# Patient Record
Sex: Male | Born: 1975 | Race: White | Hispanic: No | Marital: Married | Smoking: Former smoker
Health system: Southern US, Community
[De-identification: ages and names within clinical notes are randomized; demographics above are authoritative.]

## PROBLEM LIST (undated history)

## (undated) DIAGNOSIS — S82892A Other fracture of left lower leg, initial encounter for closed fracture: Secondary | ICD-10-CM

## (undated) HISTORY — DX: Other fracture of left lower leg, initial encounter for closed fracture: S82.892A

---

## 2004-05-03 DIAGNOSIS — S82892A Other fracture of left lower leg, initial encounter for closed fracture: Secondary | ICD-10-CM

## 2004-05-03 HISTORY — DX: Other fracture of left lower leg, initial encounter for closed fracture: S82.892A

## 2008-05-03 HISTORY — PX: KNEE ARTHROSCOPY WITH ANTERIOR CRUCIATE LIGAMENT (ACL) REPAIR: SHX5644

## 2013-05-11 ENCOUNTER — Encounter (HOSPITAL_COMMUNITY): Payer: Self-pay | Admitting: Emergency Medicine

## 2013-05-11 ENCOUNTER — Emergency Department (HOSPITAL_COMMUNITY)
Admission: EM | Admit: 2013-05-11 | Discharge: 2013-05-11 | Disposition: A | Discharge status: Home / Self Care | Payer: Medicaid Other | Source: Home / Self Care

## 2013-05-11 DIAGNOSIS — S61409A Unspecified open wound of unspecified hand, initial encounter: Secondary | ICD-10-CM

## 2013-05-11 DIAGNOSIS — S61411A Laceration without foreign body of right hand, initial encounter: Secondary | ICD-10-CM

## 2013-05-11 MED ORDER — TETANUS-DIPHTH-ACELL PERTUSSIS 5-2.5-18.5 LF-MCG/0.5 IM SUSP
INTRAMUSCULAR | Status: AC
  Filled 2013-05-11: qty 0.5

## 2013-05-11 MED ORDER — TETANUS-DIPHTH-ACELL PERTUSSIS 5-2.5-18.5 LF-MCG/0.5 IM SUSP
0.5000 mL | Freq: Once | INTRAMUSCULAR | Status: AC
  Administered 2013-05-11: 0.5 mL via INTRAMUSCULAR

## 2013-05-11 NOTE — ED Provider Notes (Signed)
CSN: [id number]     Arrival date & time 05/11/13  1227 History   First MD Initiated Contact with Patient 05/11/13 1435     Chief Complaint  Patient presents with  . Extremity Laceration   (Consider location/radiation/quality/duration/timing/severity/associated sxs/prior Treatment) HPI Comments: 38 year old Arabic male accidentally stabbed the left palm with a kitchen knife. This produced a 1/2 cm laceration. It is located at the base of the thenar eminence.   History reviewed. No pertinent past medical history. History reviewed. No pertinent past surgical history. History reviewed. No pertinent family history. History  Substance Use Topics  . Smoking status: Never Smoker   . Smokeless tobacco: Not on file  . Alcohol Use: Yes    Review of Systems  All other systems reviewed and are negative.    Allergies  Review of patient's allergies indicates no known allergies.  Home Medications  No current outpatient prescriptions on file. BP 129/84  Pulse 90  Temp(Src) 98.3 F (36.8 C)  Resp 16  SpO2 98% Physical Exam  Nursing note and vitals reviewed. Constitutional: He is oriented to person, place, and time. He appears well-developed and well-nourished. No distress.  Pulmonary/Chest: Effort normal. No respiratory distress.  Musculoskeletal: He exhibits no edema and no tenderness.  Neurological: He is alert and oriented to person, place, and time. He exhibits normal muscle tone.  Skin: Skin is warm and dry.  1.5 cm laceration to the base of the left thenar eminence. Superficial.  Psychiatric: He has a normal mood and affect.    ED Course  LACERATION REPAIR Date/Time: 05/11/2013 3:41 PM Performed by: Phineas RealMABE, Phillip Maffei Authorized by: Bradd CanaryKINDL, JAMES D Consent: Verbal consent obtained. Risks and benefits: risks, benefits and alternatives were discussed Consent given by: patient Patient understanding: patient states understanding of the procedure being performed Patient identity  confirmed: verbally with patient Body area: upper extremity Location details: left hand Laceration length: 1.5 cm Tendon involvement: none Nerve involvement: none Vascular damage: no Local anesthetic: lidocaine 2% with epinephrine Anesthetic total: 3 ml Patient sedated: no Preparation: Patient was prepped and draped in the usual sterile fashion. Irrigation solution: saline Irrigation method: syringe Amount of cleaning: standard Debridement: minimal Degree of undermining: none Skin closure: 4-0 nylon Number of sutures: 3 Technique: simple Approximation: close Approximation difficulty: simple   (including critical care time) Labs Review Labs Reviewed - No data to display Imaging Review No results found.    MDM   1. Laceration of hand, right, initial encounter     Sutured laceration Wound care instructions, watch for infection Return 10 days for SR, sooner for problems  Hayden Rasmussenavid Ishana Blades, NP 05/11/13 1543

## 2013-05-11 NOTE — Discharge Instructions (Signed)
Laceration Care, Adult °A laceration is a cut or lesion that goes through all layers of the skin and into the tissue just beneath the skin. °TREATMENT  °Some lacerations may not require closure. Some lacerations may not be able to be closed due to an increased risk of infection. It is important to see your caregiver as soon as possible after an injury to minimize the risk of infection and maximize the opportunity for successful closure. °If closure is appropriate, pain medicines may be given, if needed. The wound will be cleaned to help prevent infection. Your caregiver will use stitches (sutures), staples, wound glue (adhesive), or skin adhesive strips to repair the laceration. These tools bring the skin edges together to allow for faster healing and a better cosmetic outcome. However, all wounds will heal with a scar. Once the wound has healed, scarring can be minimized by covering the wound with sunscreen during the day for 1 full year. °HOME CARE INSTRUCTIONS  °For sutures or staples: °· Keep the wound clean and dry. °· If you were given a bandage (dressing), you should change it at least once a day. Also, change the dressing if it becomes wet or dirty, or as directed by your caregiver. °· Wash the wound with soap and water 2 times a day. Rinse the wound off with water to remove all soap. Pat the wound dry with a clean towel. °· After cleaning, apply a thin layer of the antibiotic ointment as recommended by your caregiver. This will help prevent infection and keep the dressing from sticking. °· You may shower as usual after the first 24 hours. Do not soak the wound in water until the sutures are removed. °· Only take over-the-counter or prescription medicines for pain, discomfort, or fever as directed by your caregiver. °· Get your sutures or staples removed as directed by your caregiver. °For skin adhesive strips: °· Keep the wound clean and dry. °· Do not get the skin adhesive strips wet. You may bathe  carefully, using caution to keep the wound dry. °· If the wound gets wet, pat it dry with a clean towel. °· Skin adhesive strips will fall off on their own. You may trim the strips as the wound heals. Do not remove skin adhesive strips that are still stuck to the wound. They will fall off in time. °For wound adhesive: °· You may briefly wet your wound in the shower or bath. Do not soak or scrub the wound. Do not swim. Avoid periods of heavy perspiration until the skin adhesive has fallen off on its own. After showering or bathing, gently pat the wound dry with a clean towel. °· Do not apply liquid medicine, cream medicine, or ointment medicine to your wound while the skin adhesive is in place. This may loosen the film before your wound is healed. °· If a dressing is placed over the wound, be careful not to apply tape directly over the skin adhesive. This may cause the adhesive to be pulled off before the wound is healed. °· Avoid prolonged exposure to sunlight or tanning lamps while the skin adhesive is in place. Exposure to ultraviolet light in the first year will darken the scar. °· The skin adhesive will usually remain in place for 5 to 10 days, then naturally fall off the skin. Do not pick at the adhesive film. °You may need a tetanus shot if: °· You cannot remember when you had your last tetanus shot. °· You have never had a tetanus   shot. If you get a tetanus shot, your arm may swell, get red, and feel warm to the touch. This is common and not a problem. If you need a tetanus shot and you choose not to have one, there is a rare chance of getting tetanus. Sickness from tetanus can be serious. SEEK MEDICAL CARE IF:   You have redness, swelling, or increasing pain in the wound.  You see a red line that goes away from the wound.  You have yellowish-white fluid (pus) coming from the wound.  You have a fever.  You notice a bad smell coming from the wound or dressing.  Your wound breaks open before or  after sutures have been removed.  You notice something coming out of the wound such as wood or glass.  Your wound is on your hand or foot and you cannot move a finger or toe. SEEK IMMEDIATE MEDICAL CARE IF:   Your pain is not controlled with prescribed medicine.  You have severe swelling around the wound causing pain and numbness or a change in color in your arm, hand, leg, or foot.  Your wound splits open and starts bleeding.  You have worsening numbness, weakness, or loss of function of any joint around or beyond the wound.  You develop painful lumps near the wound or on the skin anywhere on your body. MAKE SURE YOU:   Understand these instructions.  Will watch your condition.  Will get help right away if you are not doing well or get worse. Document Released: 04/19/2005 Document Revised: 07/12/2011 Document Reviewed: 10/13/2010 [facility] Patient Information 2014 [place].  Herida por pinchadura (Puncture Wound)  Una herida punzante es la que atraviesa todas las capas de la piel y del tejido por debajo de la piel(tejido subcutneo). Una herida punzante puede infectarse con facilidad cuando los grmenes ingresan al organismo y penetran debajo de la piel durante la lesin. Una herida profunda con un pequeo punto de entrada le hace difcil al mdico limpiarla adecuadamente. Por ejemplo cuando se pisa un clavo y ste ha atravesado la suela sucia de un zapato o en otras situaciones en las que la herida se ha contaminado.  CAUSAS  Muchas heridas punzantes se deben a vidrio, clavos, esquirlas, espinas de pescado u otros objetos que han ingresado a la piel (cuerpo extrao). Tambin pueden producirla las mordeduras humanas o de Kennedyanimales.  DIAGNSTICO  Generalmente se diagnostica con la historia clnica y el examen fsico. Puede ser necesario que le tomen una radiografa o una ecografa para controlar si qued algn cuerpo extrao en la herida.  TRATAMIENTO   El mdico har una  limpieza lo ms profunda posible. Segn su ubicacin, podr aplicarle unvendaje.  El Oncologistprofesional le prescribir antibiticos.  Puede ser necesario que concurra a una visita de control. Siga todas las indicaciones del mdico. INSTRUCCIONES PARA EL CUIDADO DOMICILIARIO  Cambie el vendaje una vez por da o segn le indic el profesional. Si el vendaje se adhiere, podr retirarlo remojndolo con agua.  Si el mdico se lo ha indicado, es muy importante que vuelva para Artistrealizar un control. No cumplir con este control puede dar como resultado que el dao, el dolor o la discapacidad sean permanentes o crnicos.  Slo tome medicamentos de venta libre o prescriptos para Primary school teachercalmar el dolor, las St. Michaelmolestias, o bajar la fiebre segn las indicaciones de su mdico.  Si le han recetado antibiticos, tmelos segn las indicaciones. Tmelos todos, aunque se sienta mejor. Deber aplicarse la vacuna contra el ttanos  si:  No recuerda cundo se coloc la vacuna la ltima vez.  Nunca recibi esta vacuna. Si le han aplicado la vacuna contra el ttanos, el brazo podr hincharse, enrojecer y sentirse caliente al tacto. Esto es frecuente y no es un problema. Si usted necesita aplicarse la vacuna y se niega a recibirla, corre riesgo de contraer ttanos. sta es una enfermedad grave.  Si un Regulatory affairs officer la herida, debe recibir la vacuna contra la rabia.  SOLICITE ATENCIN MDICA SI:  Presenta enrojecimiento, hinchazn o aumento del dolor en la herida.  Hay rayas rojas que salen de la herida.  Advierte un olor ftido que proviene de la herida o del vendaje.  Aparece pus en la herida.  Se le ha administrado un antibitico para la infeccin, pero no parece mejorar.  Observa algo en la herida como goma del zapato, tela u otro objeto.  Tiene fiebre.  Siente dolor intenso.  Tiene dificultad para respirar.  Se siente mareado o sufre un desmayo.  No puede dejar de vomitar.  Pierde la sensibilidad, hay  adormecimiento, o no puede mover el miembro por debajo de la herida.  Los sntomas empeoran. ASEGRESE DE QUE:   Comprende estas instrucciones.  Controlar su enfermedad.  Solicitar ayuda de inmediato si no mejora o si empeora. Document Released: 01/27/2005 Document Revised: 07/12/2011 [facility] Patient Information 2014 [place], [place].

## 2013-05-11 NOTE — ED Notes (Signed)
Holding knife in right hand to prepare food, knife slipped, sustained laceration to left palm. Denies other injury, bleeding controlled, no numbness, good ROM fingers

## 2013-05-14 ENCOUNTER — Encounter: Payer: Self-pay | Admitting: Family Medicine

## 2013-05-14 ENCOUNTER — Ambulatory Visit (INDEPENDENT_AMBULATORY_CARE_PROVIDER_SITE_OTHER): Payer: Medicaid Other | Admitting: Family Medicine

## 2013-05-14 VITALS — BP 134/82 | HR 70 | Temp 97.9°F | Ht 69.5 in | Wt 245.0 lb

## 2013-05-14 DIAGNOSIS — M25569 Pain in unspecified knee: Secondary | ICD-10-CM

## 2013-05-14 DIAGNOSIS — I8390 Asymptomatic varicose veins of unspecified lower extremity: Secondary | ICD-10-CM

## 2013-05-14 DIAGNOSIS — M25572 Pain in left ankle and joints of left foot: Secondary | ICD-10-CM

## 2013-05-14 DIAGNOSIS — M25579 Pain in unspecified ankle and joints of unspecified foot: Secondary | ICD-10-CM

## 2013-05-14 DIAGNOSIS — M25561 Pain in right knee: Secondary | ICD-10-CM | POA: Insufficient documentation

## 2013-05-14 NOTE — Assessment & Plan Note (Signed)
Uncomplicated, nothing to do.

## 2013-05-14 NOTE — Assessment & Plan Note (Addendum)
Some deformity noted Will get xrays, not repaired in [place] due to his ethnicity.   Will call with results.

## 2013-05-14 NOTE — ED Provider Notes (Signed)
Medical screening examination/treatment/procedure(s) were performed by resident physician or non-physician practitioner and as supervising physician I was immediately available for consultation/collaboration.   Torin Modica DOUGLAS MD.   Shoshanna Mcquitty D Yolinda Duerr, MD 05/14/13 1426 

## 2013-05-14 NOTE — Progress Notes (Signed)
Subjective:    Edward Klein is a 38 y.o. male [facility].  Patient is a refugee from [place] via [place], arrived in [place] in Nov 2014.  :  1.  Right knee pain:  Had ACL repair in 2010.  Injury occurred secondary to construction, fell from a height.  Still with some residual pain.  Worse with prolonged standing or after prolonged walking.  Doesn't like taking medications, occasional usage, see below.   2.  Left ankle:  Fractured ankle 2006 after falling during construction. No treatment while in [place].  Unable to walk for 3 months while it "healed" on its own.  Has occasional swelling and pain, especially after long day of work.  Takes unknown prescription of his brothers with pain relief, no OTC analgesics.    3.  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  He doesn't know what this is.  Concerned it is "nerve injury."  Swells after long day of work.  No pain or numbness.     ROS as above per HPI, otherwise neg.  Pertinently, no chest pain, palpitations, SOB, Fever, Chills, Abd pain, N/V/D.   The following portions of the patient's history were reviewed and updated as appropriate: allergies, current medications, past medical history, family and social history, and problem list. Patient is a nonsmoker.    PMH reviewed.  No past medical history on file. No past surgical history on file.  Medications reviewed. No current outpatient prescriptions on file.   No current facility-administered medications for this visit.     Objective:   Physical Exam BP 134/82  Pulse 70  Temp(Src) 97.9 F (36.6 C) (Oral)  Ht 5' 9.5" (1.765 m)  Wt 245 lb (111.131 kg)  BMI 35.67 kg/m2 Gen:  Alert, cooperative patient who appears stated age in no acute distress.  Vital signs reviewed. HEENT: EOMI,  MMM Cardiac:  Regular rate and rhythm without murmur auscultated.  Good S1/S2. Pulm:  Clear to auscultation bilaterally with good air movement.  No wheezes or rales noted.    MSK:  Left knee WNL.  Right knee with no tenderness to palpation.  No effusion noted.  Ant/post drawer with good stability, no laxity noted.  No varus/valgus laxity noted.   Right ankle WNL.  Left ankle with some chronic deformity noted, some pain with inversion of ankle.  No bruising noted.  Exts: Non edematous BL  LE, warm and well perfused.  5 cm varicose vein cluster noted on medial aspect of Left thigh.  No tenderness.   Neuro:  No focal deficits Psych:  Pleasant, smiling, not depressed, anxious appearing  No results found for this or any previous visit (from the past 72 hour(s)).

## 2013-05-14 NOTE — Patient Instructions (Signed)
Get an xray of your ankle.  Your knee hurts because of arthritis.    It was good to meet you

## 2013-05-14 NOTE — Assessment & Plan Note (Signed)
Likely arthritis based on symptoms.   Surgical repair good.  Symptomatic treatment - gave quad strengthening exercises

## 2013-05-19 ENCOUNTER — Emergency Department (INDEPENDENT_AMBULATORY_CARE_PROVIDER_SITE_OTHER)
Admission: EM | Admit: 2013-05-19 | Discharge: 2013-05-19 | Disposition: A | Discharge status: Home / Self Care | Payer: Medicaid Other | Source: Home / Self Care | Attending: Emergency Medicine | Admitting: Emergency Medicine

## 2013-05-19 ENCOUNTER — Encounter (HOSPITAL_COMMUNITY): Payer: Self-pay | Admitting: Emergency Medicine

## 2013-05-19 DIAGNOSIS — Z4802 Encounter for removal of sutures: Secondary | ICD-10-CM

## 2013-05-19 NOTE — Discharge Instructions (Signed)
Incision Care  An incision is when a surgeon cuts into your body tissues. After surgery, the incision needs to be cared for properly to prevent infection.   HOME CARE INSTRUCTIONS    Take all medicine as directed by your caregiver. Only take over-the-counter or prescription medicines for pain, discomfort, or fever as directed by your caregiver.   Do not remove your bandage (dressing) or get your incision wet until your surgeon gives you permission. In the event that your dressing becomes wet, dirty, or starts to smell, change the dressing and call your surgeon for instructions as soon as possible.   Take showers. Do not take tub baths, swim, or do anything that may soak the wound until it is healed.   Resume your normal diet and activities as directed or allowed.   Avoid lifting any weight until you are instructed otherwise.   Use anti-itch antihistamine medicine as directed by your caregiver. The wound may itch when it is healing. Do not pick or scratch at the wound.   Follow up with your caregiver for stitch (suture) or staple removal as directed.   Drink enough fluids to keep your urine clear or pale yellow.  SEEK MEDICAL CARE IF:    You have redness, swelling, or increasing pain in the wound that is not controlled with medicine.   You have drainage, blood, or pus coming from the wound that lasts longer than 1 day.   You develop muscle aches, chills, or a general ill feeling.   You notice a bad smell coming from the wound or dressing.   Your wound edges separate after the sutures, staples, or skin adhesive strips have been removed.   You develop persistent nausea or vomiting.  SEEK IMMEDIATE MEDICAL CARE IF:    You have a fever.   You develop a rash.   You develop dizzy episodes or faint while standing.   You have difficulty breathing.   You develop any reaction or side effects to medicine given.  MAKE SURE YOU:    Understand these instructions.   Will watch your condition.   Will get help  right away if you are not doing well or get worse.  Document Released: 11/06/2004 Document Revised: 07/12/2011 Document Reviewed: 08/23/2010  ExitCare Patient Information 2014 [employer], [employer].

## 2013-05-19 NOTE — ED Notes (Signed)
Presents for suture removal left palm placed 1/9.  No S/S infection.  1 suture not intact.  Wound well-approximated.

## 2013-05-19 NOTE — ED Provider Notes (Signed)
CSN: [id number]     Arrival date & time 05/19/13  1030 History   First MD Initiated Contact with Patient 05/19/13 1101     Chief Complaint  Patient presents with  . Suture / Staple Removal   (Consider location/radiation/quality/duration/timing/severity/associated sxs/prior Treatment) HPI Comments: Laceration repair 05/11/2013. Here for suture removal. No reported difficulties with wound since repair  The history is provided by the patient.    History reviewed. No pertinent past medical history. History reviewed. No pertinent past surgical history. No family history on file. History  Substance Use Topics  . Smoking status: Never Smoker   . Smokeless tobacco: Not on file  . Alcohol Use: Yes    Review of Systems  All other systems reviewed and are negative.    Allergies  Review of patient's allergies indicates no known allergies.  Home Medications  No current outpatient prescriptions on file. BP 129/71  Pulse 67  Temp(Src) 97.8 F (36.6 C) (Oral)  Resp 17  SpO2 100% Physical Exam  Nursing note and vitals reviewed. Constitutional: He is oriented to person, place, and time. He appears well-developed and well-nourished. No distress.  HENT:  Head: Normocephalic and atraumatic.  Cardiovascular: Normal rate.   Pulmonary/Chest: Effort normal.  Musculoskeletal: Normal range of motion.  Neurological: He is alert and oriented to person, place, and time.  Skin: Skin is warm and dry.  Psychiatric: He has a normal mood and affect. His behavior is normal.    ED Course  SUTURE REMOVAL Date/Time: 05/19/2013 11:43 AM Performed by: Lemmie EvensPRESSON, Kirstie Larsen LEE Authorized by: Leslee HomeKELLER, DAVID C Consent: Verbal consent obtained. Risks and benefits: risks, benefits and alternatives were discussed Consent given by: patient Patient identity confirmed: verbally with patient Body area: upper extremity Location details: left hand Wound Appearance: clean Sutures Removed: 3 Post-removal:  dressing applied Facility: sutures placed in this facility Patient tolerance: Patient tolerated the procedure well with no immediate complications.   (including critical care time) Labs Review Labs Reviewed - No data to display Imaging Review No results found.  EKG Interpretation    Date/Time:    Ventricular Rate:    PR Interval:    QRS Duration:   QT Interval:    QTC Calculation:   R Axis:     Text Interpretation:              MDM  Patient reports tetantus booster given on 05/11/2013.  Counseled regarding continued wound care at home   [facility] CenterJennifer Lee Mount VistaPresson, GeorgiaPA 05/19/13 1144

## 2013-05-19 NOTE — ED Provider Notes (Signed)
Medical screening examination/treatment/procedure(s) were performed by non-physician practitioner and as supervising physician I was immediately available for consultation/collaboration.  Curtiss Mahmood, M.D.  Katrinna Travieso C Pressley Tadesse, MD 05/19/13 2222 

## 2013-08-17 ENCOUNTER — Encounter: Payer: Self-pay | Admitting: Family Medicine

## 2020-02-02 ENCOUNTER — Emergency Department (HOSPITAL_COMMUNITY): Payer: No Typology Code available for payment source

## 2020-02-02 ENCOUNTER — Emergency Department (HOSPITAL_COMMUNITY)
Admission: EM | Admit: 2020-02-02 | Discharge: 2020-02-02 | Disposition: A | Discharge status: Home / Self Care | Payer: No Typology Code available for payment source | Attending: Emergency Medicine | Admitting: Emergency Medicine

## 2020-02-02 ENCOUNTER — Encounter (HOSPITAL_COMMUNITY): Payer: Self-pay | Admitting: Emergency Medicine

## 2020-02-02 ENCOUNTER — Other Ambulatory Visit: Payer: Self-pay

## 2020-02-02 DIAGNOSIS — S161XXA Strain of muscle, fascia and tendon at neck level, initial encounter: Secondary | ICD-10-CM

## 2020-02-02 DIAGNOSIS — M545 Low back pain, unspecified: Secondary | ICD-10-CM | POA: Insufficient documentation

## 2020-02-02 DIAGNOSIS — S199XXA Unspecified injury of neck, initial encounter: Secondary | ICD-10-CM | POA: Diagnosis present

## 2020-02-02 DIAGNOSIS — M25512 Pain in left shoulder: Secondary | ICD-10-CM
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
## 2021-01-06 ENCOUNTER — Emergency Department (HOSPITAL_COMMUNITY): Payer: Self-pay

## 2021-01-06 ENCOUNTER — Emergency Department (HOSPITAL_COMMUNITY)
Admission: EM | Admit: 2021-01-06 | Discharge: 2021-01-06 | Disposition: A | Discharge status: Home / Self Care | Payer: Self-pay | Attending: Emergency Medicine | Admitting: Emergency Medicine

## 2021-01-06 ENCOUNTER — Encounter (HOSPITAL_COMMUNITY): Payer: Self-pay | Admitting: Emergency Medicine

## 2021-01-06 ENCOUNTER — Other Ambulatory Visit: Payer: Self-pay

## 2021-01-06 DIAGNOSIS — W228XXA Striking against or struck by other objects, initial encounter: Secondary | ICD-10-CM | POA: Insufficient documentation

## 2021-01-06 DIAGNOSIS — S92421A Displaced fracture of distal phalanx of right great toe, initial encounter for closed fracture: Secondary | ICD-10-CM | POA: Insufficient documentation

## 2021-01-06 NOTE — ED Provider Notes (Signed)
[facility]PT Provider Note   CSN: [id number] Arrival date & time: 01/06/21  1458     History Chief Complaint  Patient presents with   Toe Pain    Edward Klein is a 45 y.o. male.  He is here with a complaint of right great toe injury after striking it on something last night.  Pain is worse with movement and ambulation.  No other injuries or complaints.  The history is provided by the patient.  Toe Pain This is a new problem. The current episode started yesterday. The problem occurs constantly. The problem has not changed since onset.Pertinent negatives include no chest pain, no abdominal pain, no headaches and no shortness of breath. The symptoms are aggravated by walking and standing. Nothing relieves the symptoms. He has tried rest for the symptoms. The treatment provided no relief.      Past Medical History:  Diagnosis Date   Closed left ankle fracture 2006   Never repaired    Patient Active Problem List   Diagnosis Date Noted   Right knee pain 05/14/2013   Left ankle pain 05/14/2013   Varicose veins 05/14/2013    Past Surgical History:  Procedure Laterality Date   KNEE ARTHROSCOPY WITH ANTERIOR CRUCIATE LIGAMENT (ACL) REPAIR Right 2010       No family history on file.  Social History   Tobacco Use   Smoking status: Never   Smokeless tobacco: Never  Vaping Use   Vaping Use: Never used  Substance Use Topics   Alcohol use: Yes   Drug use: No    Home Medications Prior to Admission medications   Not on File    Allergies    Patient has no known allergies.  Review of Systems   Review of Systems  Respiratory:  Negative for shortness of breath.   Cardiovascular:  Negative for chest pain.  Gastrointestinal:  Negative for abdominal pain.  Skin:  Negative for wound.  Neurological:  Negative for headaches.   Physical Exam Updated Vital Signs BP (!) 154/98   Pulse 75   Temp 98.8 F (37.1 C) (Oral)   Resp 18    SpO2 97%   Physical Exam Vitals and nursing note reviewed.  Constitutional:      Appearance: Normal appearance. He is well-developed.  HENT:     Head: Normocephalic and atraumatic.  Eyes:     Conjunctiva/sclera: Conjunctivae normal.  Pulmonary:     Effort: Pulmonary effort is normal.  Musculoskeletal:        General: Tenderness and signs of injury present.     Cervical back: Neck supple.     Comments: Patient's right great toe is swollen and slightly reddened.  The nail is thickened but intact.  There is no open wounds.  He has tenderness primarily at the IP joint.  Rest of the foot is nontender.  Distal pulses intact.  Skin:    General: Skin is warm and dry.  Neurological:     General: No focal deficit present.     Mental Status: He is alert.     GCS: GCS eye subscore is 4. GCS verbal subscore is 5. GCS motor subscore is 6.    ED Results / Procedures / Treatments   Labs (all labs ordered are listed, but only abnormal results are displayed) Labs Reviewed - No data to display  EKG None  Radiology DG Toe Great Right  Result Date: 01/06/2021 CLINICAL DATA:  Injury.  Right large toe swelling and pain.  EXAM: RIGHT GREAT TOE COMPARISON:  None. FINDINGS: There is an acute comminuted fracture involving the base of the first distal phalanx. There is intra-articular extension. Fracture fragments are distracted 3 mm along the articulating surface and along the posterior aspect. There is no dislocation. There is surrounding soft tissue swelling. There is no radiopaque foreign body. IMPRESSION: 1. Comminuted mildly displaced fracture base of the first distal phalanx with intra-articular extension. 2. No dislocation. Electronically Signed   By: Darliss Cheney M.D.   On: 01/06/2021 17:05    Procedures Procedures   Medications Ordered in ED Medications - No data to display  ED Course  I have reviewed the triage vital signs and the nursing notes.  Pertinent labs & imaging results that were  available during my care of the patient were reviewed by me and considered in my medical decision making (see chart for details).    MDM Rules/Calculators/A&P                          Differential includes fracture, dislocation, subluxation, nailbed injury, contusion  Final Clinical Impression(s) / ED Diagnoses Final diagnoses:  Closed displaced fracture of distal phalanx of right great toe, initial encounter    Rx / DC Orders ED Discharge Orders     None        Terrilee Files, MD 01/07/21 1011

## 2021-01-06 NOTE — Discharge Instructions (Signed)
You were seen in the emergency department for evaluation of right great toe injury.  Your x-ray showed a fracture of the toe.  You can use Tylenol and ibuprofen for pain.  The hard sole shoe for comfort.  Please follow-up with [facility].

## 2021-01-06 NOTE — ED Triage Notes (Addendum)
Per pt, states he kicked something last night injuring right large toe-swelling and pain

## 2021-06-08 ENCOUNTER — Encounter (HOSPITAL_COMMUNITY): Payer: Self-pay | Admitting: Emergency Medicine

## 2021-06-08 ENCOUNTER — Ambulatory Visit (HOSPITAL_COMMUNITY)
Admission: EM | Admit: 2021-06-08 | Discharge: 2021-06-08 | Disposition: A | Discharge status: Home / Self Care | Payer: Medicaid Other | Attending: Physician Assistant | Admitting: Physician Assistant

## 2021-06-08 ENCOUNTER — Other Ambulatory Visit: Payer: Self-pay

## 2021-06-08 DIAGNOSIS — R051 Acute cough: Secondary | ICD-10-CM | POA: Diagnosis present

## 2021-06-08 DIAGNOSIS — Z20822 Contact with and (suspected) exposure to covid-19: Secondary | ICD-10-CM | POA: Diagnosis not present

## 2021-06-08 DIAGNOSIS — J069 Acute upper respiratory infection, unspecified: Secondary | ICD-10-CM | POA: Diagnosis not present

## 2021-06-08 LAB — POC INFLUENZA A AND B ANTIGEN (URGENT CARE ONLY)
INFLUENZA A ANTIGEN, POC: NEGATIVE
INFLUENZA B ANTIGEN, POC: NEGATIVE

## 2021-06-08 MED ORDER — PROMETHAZINE-DM 6.25-15 MG/5ML PO SYRP: 5.0000 mL | ORAL_SOLUTION | Freq: Two times a day (BID) | ORAL | 0 refills | Status: DC | PRN

## 2021-06-08 MED ORDER — PREDNISONE 20 MG PO TABS: 40.0000 mg | ORAL_TABLET | Freq: Every day | ORAL | 0 refills | Status: DC

## 2021-06-08 NOTE — ED Triage Notes (Signed)
Cough, sore throat and chest soreness-onset Friday.  Denies fever.  Complains of having chills

## 2021-06-08 NOTE — ED Provider Notes (Signed)
[facility]    CSN: [id number] Arrival date & time: 06/08/21  1211      History   Chief Complaint Chief Complaint  Patient presents with   Cough    HPI Torence Cruze is a 46 y.o. male.   Patient presents today with a several day history of URI symptoms including cough, sore throat, chest tightness, shortness of breath, hoarseness.  Denies any fever, chest pain, nausea, vomiting, diarrhea.  He has not tried any over-the-counter medication for symptom management.  Reports household sick contacts with similar symptoms including his children and wife but is unsure what they were ultimately diagnosed with.  He denies any significant past medical history including asthma, allergies, COPD.  He does smoke.  He denies history of diabetes or immunosuppression.  He has had COVID-19 vaccine.  He has had COVID in the past with last episode approximately 2 years ago.  Denies any recent antibiotic use.  He is eating and drinking normally.   Past Medical History:  Diagnosis Date   Closed left ankle fracture 2006   Never repaired    Patient Active Problem List   Diagnosis Date Noted   Right knee pain 05/14/2013   Left ankle pain 05/14/2013   Varicose veins 05/14/2013    Past Surgical History:  Procedure Laterality Date   KNEE ARTHROSCOPY WITH ANTERIOR CRUCIATE LIGAMENT (ACL) REPAIR Right 2010       Home Medications    Prior to Admission medications   Medication Sig Start Date End Date Taking? Authorizing Provider  predniSONE (DELTASONE) 20 MG tablet Take 2 tablets (40 mg total) by mouth daily for 4 days. 06/08/21 06/12/21 Yes Tarique Loveall K, PA-C  promethazine-dextromethorphan (PROMETHAZINE-DM) 6.25-15 MG/5ML syrup Take 5 mLs by mouth 2 (two) times daily as needed for cough. 06/08/21  Yes Alexanderjames Berg, Derry Skill, PA-C    Family History Family History  Problem Relation Age of Onset   Hypertension Mother    Healthy Father     Social History Social History   Tobacco Use    Smoking status: Every Day    Types: Cigarettes   Smokeless tobacco: Never  Vaping Use   Vaping Use: Never used  Substance Use Topics   Alcohol use: Yes   Drug use: No     Allergies   Patient has no known allergies.   Review of Systems Review of Systems  Constitutional:  Positive for activity change, chills and fatigue. Negative for appetite change and fever.  HENT:  Positive for congestion and sore throat. Negative for sinus pressure and sneezing.   Respiratory:  Positive for cough and shortness of breath.   Cardiovascular:  Negative for chest pain.  Gastrointestinal:  Negative for abdominal pain, diarrhea, nausea and vomiting.  Musculoskeletal:  Positive for arthralgias and myalgias.  Neurological:  Positive for headaches. Negative for dizziness and light-headedness.    Physical Exam Triage Vital Signs ED Triage Vitals  Enc Vitals Group     BP 06/08/21 1424 132/84     Pulse Rate 06/08/21 1424 90     Resp 06/08/21 1424 20     Temp 06/08/21 1424 99.4 F (37.4 C)     Temp Source 06/08/21 1424 Oral     SpO2 06/08/21 1424 94 %     Weight --      Height --      Head Circumference --      Peak Flow --      Pain Score 06/08/21 1421 7  Pain Loc --      Pain Edu? --      Excl. in [place]? --    No data found.  Updated Vital Signs BP 132/84 (BP Location: Right Arm) Comment (BP Location): large cuff   Pulse 90    Temp 99.4 F (37.4 C) (Oral)    Resp 20    SpO2 94%   Visual Acuity Right Eye Distance:   Left Eye Distance:   Bilateral Distance:    Right Eye Near:   Left Eye Near:    Bilateral Near:     Physical Exam Vitals reviewed.  Constitutional:      General: He is awake.     Appearance: Normal appearance. He is well-developed. He is not ill-appearing.     Comments: Very pleasant male appears stated age in no acute distress sitting comfortably in exam room  HENT:     Head: Normocephalic and atraumatic.     Right Ear: Tympanic membrane, ear canal and external  ear normal. Tympanic membrane is not erythematous or bulging.     Left Ear: Tympanic membrane, ear canal and external ear normal. Tympanic membrane is not erythematous or bulging.     Nose: Nose normal.     Mouth/Throat:     Pharynx: Uvula midline. Posterior oropharyngeal erythema present. No oropharyngeal exudate or uvula swelling.     Tonsils: No tonsillar exudate or tonsillar abscesses.  Cardiovascular:     Rate and Rhythm: Normal rate and regular rhythm.     Heart sounds: Normal heart sounds, S1 normal and S2 normal. No murmur heard. Pulmonary:     Effort: Pulmonary effort is normal. No accessory muscle usage or respiratory distress.     Breath sounds: Normal breath sounds. No stridor. No wheezing, rhonchi or rales.     Comments: Clear to auscultation bilaterally Abdominal:     General: Bowel sounds are normal.     Palpations: Abdomen is soft.     Tenderness: There is no abdominal tenderness.  Lymphadenopathy:     Head:     Right side of head: No submental, submandibular or tonsillar adenopathy.     Left side of head: No submental, submandibular or tonsillar adenopathy.     Cervical: No cervical adenopathy.  Neurological:     Mental Status: He is alert.  Psychiatric:        Behavior: Behavior is cooperative.     UC Treatments / Results  Labs (all labs ordered are listed, but only abnormal results are displayed) Labs Reviewed  SARS CORONAVIRUS 2 (TAT 6-24 HRS)  POC INFLUENZA A AND B ANTIGEN (URGENT CARE ONLY)    EKG   Radiology No results found.  Procedures Procedures (including critical care time)  Medications Ordered in UC Medications - No data to display  Initial Impression / Assessment and Plan / UC Course  I have reviewed the triage vital signs and the nursing notes.  Pertinent labs & imaging results that were available during my care of the patient were reviewed by me and considered in my medical decision making (see chart for details).     Vital signs  and physical exam reassuring today; no indication for emergent evaluation or imaging.  No evidence of acute infection that warrant initiation of antibiotics today.  Flu testing was negative.  COVID test is pending.  Patient was provided work excuse note with current CDC return to work guidelines based on COVID test result.  If he is positive for COVID he is low  risk for progression given no significant past medical history and he is under the age of 22 so would not qualify for antiviral medication.  We will start prednisone to help manage symptoms and he was instructed to take NSAIDs with this medication due to risk of GI bleeding.  He can use Mucinex, Flonase, Tylenol for additional symptom relief.  He was prescribed Promethazine DM for cough with instruction not to drive or drink alcohol with this medication as drowsiness is a common side effect.  He is to rest and drink plenty of fluid.  Discussed that if symptoms do not improve by next week he should return here or see his PCP.  If he has any worsening symptoms including high fever, chest pain, shortness of breath, nausea/vomiting interfering with oral intake, weakness he needs to be seen immediately.  Strict return precautions given to which he expressed understanding.  Final Clinical Impressions(s) / UC Diagnoses   Final diagnoses:  Upper respiratory tract infection, unspecified type  Acute cough     Discharge Instructions      Your flu test was negative.  We will contact you if your COVID test is positive.  Please stay at home.  Start prednisone as prescribed.  Do not take NSAIDs including aspirin, ibuprofen/Advil, naproxen/Aleve with this medication as it can cause stomach bleeding.  Take Promethazine DM for cough.  This can make you sleepy so do not drive or drink alcohol when taking it.  Use Tylenol, Mucinex, Flonase for additional symptom relief.  Make sure you rest and drink plenty of fluid.  If your symptoms have not improved by next week  please return here for reevaluation.  If you develop any chest pain, shortness of breath, high fever, nausea/vomiting, weakness you need to go to the emergency room.     ED Prescriptions     Medication Sig Dispense Auth. Provider   predniSONE (DELTASONE) 20 MG tablet Take 2 tablets (40 mg total) by mouth daily for 4 days. 8 tablet Iliya Spivack K, PA-C   promethazine-dextromethorphan (PROMETHAZINE-DM) 6.25-15 MG/5ML syrup Take 5 mLs by mouth 2 (two) times daily as needed for cough. 118 mL Tyrome Donatelli K, PA-C      PDMP not reviewed this encounter.   Terrilee Croak, PA-C 06/08/21 1605

## 2021-06-08 NOTE — Discharge Instructions (Signed)
Your flu test was negative.  We will contact you if your COVID test is positive.  Please stay at home.  Start prednisone as prescribed.  Do not take NSAIDs including aspirin, ibuprofen/Advil, naproxen/Aleve with this medication as it can cause stomach bleeding.  Take Promethazine DM for cough.  This can make you sleepy so do not drive or drink alcohol when taking it.  Use Tylenol, Mucinex, Flonase for additional symptom relief.  Make sure you rest and drink plenty of fluid.  If your symptoms have not improved by next week please return here for reevaluation.  If you develop any chest pain, shortness of breath, high fever, nausea/vomiting, weakness you need to go to the emergency room.

## 2021-06-08 NOTE — ED Notes (Signed)
Covid and flu swab in lab °

## 2021-06-09 LAB — SARS CORONAVIRUS 2 (TAT 6-24 HRS): SARS Coronavirus 2: NEGATIVE

## 2021-06-11 ENCOUNTER — Ambulatory Visit (HOSPITAL_COMMUNITY)
Admission: EM | Admit: 2021-06-11 | Discharge: 2021-06-11 | Disposition: A | Discharge status: Home / Self Care | Payer: Medicaid Other | Attending: Family Medicine | Admitting: Family Medicine

## 2021-06-11 ENCOUNTER — Ambulatory Visit (INDEPENDENT_AMBULATORY_CARE_PROVIDER_SITE_OTHER): Payer: Medicaid Other

## 2021-06-11 ENCOUNTER — Encounter (HOSPITAL_COMMUNITY): Payer: Self-pay

## 2021-06-11 DIAGNOSIS — J069 Acute upper respiratory infection, unspecified: Secondary | ICD-10-CM

## 2021-06-11 DIAGNOSIS — R091 Pleurisy: Secondary | ICD-10-CM

## 2021-06-11 DIAGNOSIS — R059 Cough, unspecified: Secondary | ICD-10-CM | POA: Diagnosis not present

## 2021-06-11 DIAGNOSIS — R042 Hemoptysis: Secondary | ICD-10-CM | POA: Diagnosis not present

## 2021-06-11 LAB — POCT RAPID STREP A, ED / UC: Streptococcus, Group A Screen (Direct): NEGATIVE

## 2021-06-11 MED ORDER — CHERATUSSIN AC 100-10 MG/5ML PO SOLN: 5.0000 mL | Freq: Four times a day (QID) | ORAL | 0 refills | Status: DC | PRN

## 2021-06-11 MED ORDER — IBUPROFEN 800 MG PO TABS: 800.0000 mg | ORAL_TABLET | Freq: Three times a day (TID) | ORAL | 0 refills | Status: DC | PRN

## 2021-06-11 NOTE — ED Provider Notes (Signed)
[place]    CSN: [id number] Arrival date & time: 06/11/21  1421      History   Chief Complaint Chief Complaint  Patient presents with   Follow-up    HPI Edward Klein is a 46 y.o. male.   HPI Here for continued cough and worsened hemoptysis. He began having cough and congestion on February 3.  He was seen here on February 6.  At that time he had a negative flu test and his COVID test was negative.  Since he was seen he has begun spitting up more blood in his mucus.  He does feel like mucus is stuck in his throat.  Also his pleuritic chest pain is somewhat worse.  He is having some posttussive emesis.  Otherwise he is not nauseated.  The cough has been particularly bothersome at night.  Past Medical History:  Diagnosis Date   Closed left ankle fracture 2006   Never repaired    Patient Active Problem List   Diagnosis Date Noted   Right knee pain 05/14/2013   Left ankle pain 05/14/2013   Varicose veins 05/14/2013    Past Surgical History:  Procedure Laterality Date   KNEE ARTHROSCOPY WITH ANTERIOR CRUCIATE LIGAMENT (ACL) REPAIR Right 2010       Home Medications    Prior to Admission medications   Medication Sig Start Date End Date Taking? Authorizing Provider  guaiFENesin-codeine (CHERATUSSIN AC) 100-10 MG/5ML syrup Take 5 mLs by mouth 4 (four) times daily as needed for cough. 06/11/21  Yes Barrett Henle, MD  ibuprofen (ADVIL) 800 MG tablet Take 1 tablet (800 mg total) by mouth every 8 (eight) hours as needed (pain). 06/11/21  Yes Anaaya Fuster, Gwenlyn Perking, MD    Family History Family History  Problem Relation Age of Onset   Hypertension Mother    Healthy Father     Social History Social History   Tobacco Use   Smoking status: Every Day    Types: Cigarettes   Smokeless tobacco: Never  Vaping Use   Vaping Use: Never used  Substance Use Topics   Alcohol use: Yes   Drug use: No     Allergies   Patient has no known allergies.   Review of  Systems Review of Systems   Physical Exam Triage Vital Signs ED Triage Vitals  Enc Vitals Group     BP 06/11/21 1603 (!) 141/90     Pulse Rate 06/11/21 1603 82     Resp 06/11/21 1603 (!) 22     Temp 06/11/21 1603 98.6 F (37 C)     Temp Source 06/11/21 1603 Oral     SpO2 06/11/21 1603 94 %     Weight --      Height --      Head Circumference --      Peak Flow --      Pain Score 06/11/21 1607 5     Pain Loc --      Pain Edu? --      Excl. in [place]? --    No data found.  Updated Vital Signs BP (!) 141/90 (BP Location: Right Arm)    Pulse 82    Temp 98.6 F (37 C) (Oral)    Resp (!) 22    SpO2 94%   Visual Acuity Right Eye Distance:   Left Eye Distance:   Bilateral Distance:    Right Eye Near:   Left Eye Near:    Bilateral Near:  Physical Exam Vitals reviewed.  Constitutional:      General: He is not in acute distress.    Appearance: He is not toxic-appearing.     Comments: Voice is hoarse  HENT:     Right Ear: Tympanic membrane and ear canal normal.     Left Ear: Tympanic membrane and ear canal normal.     Nose: Nose normal.     Mouth/Throat:     Mouth: Mucous membranes are moist.     Pharynx: No posterior oropharyngeal erythema.     Comments: His tonsils are enlarged 2+, and have a good bit of clear mucus in them Eyes:     Extraocular Movements: Extraocular movements intact.     Conjunctiva/sclera: Conjunctivae normal.     Pupils: Pupils are equal, round, and reactive to light.  Cardiovascular:     Rate and Rhythm: Normal rate and regular rhythm.     Heart sounds: No murmur heard. Pulmonary:     Effort: Pulmonary effort is normal.     Breath sounds: No stridor. No wheezing, rhonchi or rales.  Chest:     Chest wall: Tenderness (tender bilateral anterior upper chest, just a little) present.  Musculoskeletal:     Cervical back: Neck supple.  Lymphadenopathy:     Cervical: No cervical adenopathy.  Skin:    Capillary Refill: Capillary refill takes less  than 2 seconds.     Coloration: Skin is not jaundiced or pale.  Neurological:     General: No focal deficit present.     Mental Status: He is alert and oriented to person, place, and time.  Psychiatric:        Behavior: Behavior normal.     UC Treatments / Results  Labs (all labs ordered are listed, but only abnormal results are displayed) Labs Reviewed  CULTURE, GROUP A STREP [facility])  POCT RAPID STREP A, ED / UC    EKG   Radiology DG Chest 2 View  Result Date: 06/11/2021 CLINICAL DATA:  cough,pleuritic cp, hemoptysis EXAM: CHEST - 2 VIEW COMPARISON:  02/02/2020 FINDINGS: The heart size and mediastinal contours are within normal limits. No focal airspace consolidation, pleural effusion, or pneumothorax. The visualized skeletal structures are unremarkable. IMPRESSION: No active cardiopulmonary disease. Electronically Signed   By: Duanne Guess D.O.   On: 06/11/2021 16:43    Procedures Procedures (including critical care time)  Medications Ordered in UC Medications - No data to display  Initial Impression / Assessment and Plan / UC Course  I have reviewed the triage vital signs and the nursing notes.  Pertinent labs & imaging results that were available during my care of the patient were reviewed by me and considered in my medical decision making (see chart for details).     Chest x-ray is negative for pneumonia.  Rapid strep test is negative.  Throat culture sent.  We will try a different cough syrup with some codeine to see if that is more effective.  Also will ask him to try using a humidifier in his house He is finishing his prednisone. Final Clinical Impressions(s) / UC Diagnoses   Final diagnoses:  None     Discharge Instructions      Your strep test was negative Your chest x-ray was clear I have sent a different cough syrup in for you, it is Robitussin with codeine.  You can take 1 teaspoon every 6 hours as needed for cough. Also, it is possible that  running a humidifier in your house  may help some.  Take ibuprofen 800 mg 1 every 8 hours as needed for pain     ED Prescriptions     Medication Sig Dispense Auth. Provider   guaiFENesin-codeine (CHERATUSSIN AC) 100-10 MG/5ML syrup Take 5 mLs by mouth 4 (four) times daily as needed for cough. 120 mL Barrett Henle, MD   ibuprofen (ADVIL) 800 MG tablet Take 1 tablet (800 mg total) by mouth every 8 (eight) hours as needed (pain). 21 tablet Suzane Vanderweide, Gwenlyn Perking, MD      I have reviewed the PDMP during this encounter. And no narcotic fills on it.   Barrett Henle, MD 06/11/21 [id number]

## 2021-06-11 NOTE — ED Triage Notes (Signed)
Pt presents for ongoing sore throat, chest cain from cough, non productive cough , emesis, nose bleed, diarrhea and SOB that is unrelieved with prescribed medication; pt was seen in UC on Monday.

## 2021-06-11 NOTE — Discharge Instructions (Addendum)
Your strep test was negative Your chest x-ray was clear I have sent a different cough syrup in for you, it is Robitussin with codeine.  You can take 1 teaspoon every 6 hours as needed for cough. Also, it is possible that running a humidifier in your house may help some.  Take ibuprofen 800 mg 1 every 8 hours as needed for pain

## 2021-06-14 LAB — CULTURE, GROUP A STREP (THRC)

## 2021-07-17 ENCOUNTER — Other Ambulatory Visit: Payer: Self-pay

## 2021-07-17 ENCOUNTER — Encounter (HOSPITAL_COMMUNITY): Payer: Self-pay | Admitting: Emergency Medicine

## 2021-07-17 ENCOUNTER — Ambulatory Visit (HOSPITAL_COMMUNITY)
Admission: EM | Admit: 2021-07-17 | Discharge: 2021-07-17 | Disposition: A | Discharge status: Home / Self Care | Payer: Medicaid Other | Attending: Family Medicine | Admitting: Family Medicine

## 2021-07-17 DIAGNOSIS — M25562 Pain in left knee: Secondary | ICD-10-CM

## 2021-07-17 DIAGNOSIS — M545 Low back pain, unspecified: Secondary | ICD-10-CM

## 2021-07-17 MED ORDER — TIZANIDINE HCL 4 MG PO TABS: 4.0000 mg | ORAL_TABLET | Freq: Three times a day (TID) | ORAL | 0 refills | Status: DC | PRN

## 2021-07-17 MED ORDER — NAPROXEN 500 MG PO TABS: 500.0000 mg | ORAL_TABLET | Freq: Two times a day (BID) | ORAL | 0 refills | Status: DC

## 2021-07-17 NOTE — ED Triage Notes (Signed)
Pt c/o numbness in upper left leg and pains in leg as well as swelling that is intermittent to left knee. Reports will have cold like sensations running down leg. Reports does have some pains in lower left back as well. Did some lifting 2 weeks ago before symptoms started. Denies any falls or injuries.  ?

## 2021-07-17 NOTE — Discharge Instructions (Addendum)
Take naproxen 500 mg twice daily as needed for pain ? ?Take tizanidine 4 mg 1 every 8 hours as needed for muscle spasm or muscle pain.  This medicine can make you sleepy ? ?Ice and elevate your knee ?

## 2021-07-17 NOTE — ED Provider Notes (Signed)
?[facility] ? ? ? ?CSN: [id number] ?Arrival date & time: 07/17/21  1523 ? ? ?  ? ?History   ?Chief Complaint ?Chief Complaint  ?Patient presents with  ? Back Pain  ? Leg Pain  ? ? ?HPI ?Edward Klein is a 46 y.o. male.  ? ? ?Back Pain ?Associated symptoms: leg pain   ?Leg Pain ?Associated symptoms: back pain   ?Here with pain in left knee. Began when he was crouching to pick up something (not too heavy), and when he stood up and straightened his knees, he felt a pop in the medial left knee. Has been hurting some since. Also has had more chronic low back, left side. Also having some numbness in upper left leg and pain in leg that can radiate to left knee. Feels like cold water sensation running down leg. ? ? ? ? ?Discussed possibly doing an xray or two; he declined. Discussed that most likely this was not a bony injury ? ? ?Past Medical History:  ?Diagnosis Date  ? Closed left ankle fracture 2006  ? Never repaired  ? ? ?Patient Active Problem List  ? Diagnosis Date Noted  ? Right knee pain 05/14/2013  ? Left ankle pain 05/14/2013  ? Varicose veins 05/14/2013  ? ? ?Past Surgical History:  ?Procedure Laterality Date  ? KNEE ARTHROSCOPY WITH ANTERIOR CRUCIATE LIGAMENT (ACL) REPAIR Right 2010  ? ? ? ? ? ?Home Medications   ? ?Prior to Admission medications   ?Medication Sig Start Date End Date Taking? Authorizing Provider  ?naproxen (NAPROSYN) 500 MG tablet Take 1 tablet (500 mg total) by mouth 2 (two) times daily. 07/17/21  Yes Zenia Resides, MD  ?tiZANidine (ZANAFLEX) 4 MG tablet Take 1 tablet (4 mg total) by mouth every 8 (eight) hours as needed for muscle spasms. 07/17/21  Yes Zenia Resides, MD  ? ? ?Family History ?Family History  ?Problem Relation Age of Onset  ? Hypertension Mother   ? Healthy Father   ? ? ?Social History ?Social History  ? ?Tobacco Use  ? Smoking status: Every Day  ?  Types: Cigarettes  ? Smokeless tobacco: Never  ?Vaping Use  ? Vaping Use: Never used  ?Substance Use Topics  ?  Alcohol use: Yes  ? Drug use: No  ? ? ? ?Allergies   ?Patient has no known allergies. ? ? ?Review of Systems ?Review of Systems  ?Musculoskeletal:  Positive for back pain.  ? ? ?Physical Exam ?Triage Vital Signs ?ED Triage Vitals  ?Enc Vitals Group  ?   BP 07/17/21 1633 130/88  ?   Pulse Rate 07/17/21 1633 86  ?   Resp 07/17/21 1633 18  ?   Temp 07/17/21 1633 98.5 ?F (36.9 ?C)  ?   Temp Source 07/17/21 1633 Oral  ?   SpO2 07/17/21 1633 96 %  ?   Weight --   ?   Height --   ?   Head Circumference --   ?   Peak Flow --   ?   Pain Score 07/17/21 1632 6  ?   Pain Loc --   ?   Pain Edu? --   ?   Excl. in [place]? --   ? ?No data found. ? ?Updated Vital Signs ?BP 130/88 (BP Location: Left Arm)   Pulse 86   Temp 98.5 ?F (36.9 ?C) (Oral)   Resp 18   SpO2 96%  ? ?Visual Acuity ?Right Eye Distance:   ?Left Eye Distance:   ?  Bilateral Distance:   ? ?Right Eye Near:   ?Left Eye Near:    ?Bilateral Near:    ? ?Physical Exam ?Vitals reviewed.  ?Constitutional:   ?   General: He is not in acute distress. ?   Appearance: He is not toxic-appearing.  ?HENT:  ?   Mouth/Throat:  ?   Mouth: Mucous membranes are moist.  ?Eyes:  ?   Extraocular Movements: Extraocular movements intact.  ?Cardiovascular:  ?   Rate and Rhythm: Normal rate and regular rhythm.  ?   Heart sounds: No murmur heard. ?Pulmonary:  ?   Effort: Pulmonary effort is normal.  ?   Breath sounds: Normal breath sounds.  ?Musculoskeletal:  ?   Right lower leg: No edema.  ?   Left lower leg: No edema.  ?   Comments: Straight leg raise negative.  The inferior left knee is mildly tender  ?Skin: ?   Capillary Refill: Capillary refill takes less than 2 seconds.  ?Neurological:  ?   General: No focal deficit present.  ?   Mental Status: He is oriented to person, place, and time.  ?Psychiatric:     ?   Behavior: Behavior normal.  ? ? ? ?UC Treatments / Results  ?Labs ?(all labs ordered are listed, but only abnormal results are displayed) ?Labs Reviewed - No data to  display ? ?EKG ? ? ?Radiology ?No results found. ? ?Procedures ?Procedures (including critical care time) ? ?Medications Ordered in UC ?Medications - No data to display ? ?Initial Impression / Assessment and Plan / UC Course  ?I have reviewed the triage vital signs and the nursing notes. ? ?Pertinent labs & imaging results that were available during my care of the patient were reviewed by me and considered in my medical decision making (see chart for details). ? ?  ? ?We will treat with NSAID and muscle relaxer.  Request is made to help him find a PCP.  Also he is given orthopedic contact info ?Final Clinical Impressions(s) / UC Diagnoses  ? ?Final diagnoses:  ?Acute pain of left knee  ?Acute left-sided low back pain without sciatica  ? ? ? ?Discharge Instructions   ? ?  ?Take naproxen 500 mg twice daily as needed for pain ? ?Take tizanidine 4 mg 1 every 8 hours as needed for muscle spasm or muscle pain.  This medicine can make you sleepy ? ?Ice and elevate your knee ? ? ? ? ?ED Prescriptions   ? ? Medication Sig Dispense Auth. Provider  ? naproxen (NAPROSYN) 500 MG tablet Take 1 tablet (500 mg total) by mouth 2 (two) times daily. 30 tablet Carliss Quast, Janace Aris, MD  ? tiZANidine (ZANAFLEX) 4 MG tablet Take 1 tablet (4 mg total) by mouth every 8 (eight) hours as needed for muscle spasms. 30 tablet Zenia Resides, MD  ? ?  ? ?I have reviewed the PDMP during this encounter. ?  ?Zenia Resides, MD ?07/17/21 1659 ? ?

## 2021-08-20 ENCOUNTER — Ambulatory Visit (HOSPITAL_COMMUNITY)
Admission: EM | Admit: 2021-08-20 | Discharge: 2021-08-20 | Disposition: A | Discharge status: Home / Self Care | Payer: Medicaid Other | Attending: Nurse Practitioner | Admitting: Nurse Practitioner

## 2021-08-20 ENCOUNTER — Encounter (HOSPITAL_COMMUNITY): Payer: Self-pay

## 2021-08-20 DIAGNOSIS — J020 Streptococcal pharyngitis: Secondary | ICD-10-CM | POA: Diagnosis not present

## 2021-08-20 LAB — POC INFLUENZA A AND B ANTIGEN (URGENT CARE ONLY)
INFLUENZA A ANTIGEN, POC: NEGATIVE
INFLUENZA B ANTIGEN, POC: NEGATIVE

## 2021-08-20 LAB — POCT RAPID STREP A, ED / UC: Streptococcus, Group A Screen (Direct): POSITIVE — AB

## 2021-08-20 MED ORDER — PENICILLIN V POTASSIUM 500 MG PO TABS: 500.0000 mg | ORAL_TABLET | Freq: Three times a day (TID) | ORAL | 0 refills | Status: AC

## 2021-08-20 MED ORDER — IBUPROFEN 800 MG PO TABS: 800.0000 mg | ORAL_TABLET | Freq: Three times a day (TID) | ORAL | 0 refills | Status: DC | PRN

## 2021-08-20 MED ORDER — LIDOCAINE VISCOUS HCL 2 % MT SOLN: 5.0000 mL | OROMUCOSAL | 0 refills | Status: DC | PRN

## 2021-08-20 NOTE — ED Provider Notes (Signed)
?[facility] ? ? ? ?CSN: [id number] ?Arrival date & time: 08/20/21  1328 ? ? ?  ? ?History   ?Chief Complaint ?Chief Complaint  ?Patient presents with  ? Sore Throat  ? Hoarse  ? ? ?HPI ?Edward Klein is a 46 y.o. male.  ? ?The patient is a 46 year old male who presents for sore throat.  He states symptoms started about 2 days ago.  He also complains of fever, chills, headache, trouble swallowing, hoarseness.  He denies cough, nasal congestion, or GI symptoms.  He states that he has not been able to eat because his throat is so sore.  He denies any sick contacts.  He has not taken any medication for his symptoms. ? ?The history is provided by the patient.  ? ?Past Medical History:  ?Diagnosis Date  ? Closed left ankle fracture 2006  ? Never repaired  ? ? ?Patient Active Problem List  ? Diagnosis Date Noted  ? Right knee pain 05/14/2013  ? Left ankle pain 05/14/2013  ? Varicose veins 05/14/2013  ? ? ?Past Surgical History:  ?Procedure Laterality Date  ? KNEE ARTHROSCOPY WITH ANTERIOR CRUCIATE LIGAMENT (ACL) REPAIR Right 2010  ? ? ? ? ? ?Home Medications   ? ?Prior to Admission medications   ?Medication Sig Start Date End Date Taking? Authorizing Provider  ?ibuprofen (ADVIL) 800 MG tablet Take 1 tablet (800 mg total) by mouth every 8 (eight) hours as needed. 08/20/21  Yes Yazmeen Woolf-Warren, Sadie Haberhristie J, NP  ?lidocaine (XYLOCAINE) 2 % solution Use as directed 5 mLs in the mouth or throat every 4 (four) hours as needed for mouth pain. 08/20/21  Yes Yaiden Yang-Warren, Sadie Haberhristie J, NP  ?penicillin v potassium (VEETID) 500 MG tablet Take 1 tablet (500 mg total) by mouth 3 (three) times daily for 10 days. 08/20/21 08/30/21 Yes Shawniece Oyola-Warren, Sadie Haberhristie J, NP  ?naproxen (NAPROSYN) 500 MG tablet Take 1 tablet (500 mg total) by mouth 2 (two) times daily. ?Patient not taking: Reported on 08/20/2021 07/17/21   Zenia ResidesBanister, Pamela K, MD  ?tiZANidine (ZANAFLEX) 4 MG tablet Take 1 tablet (4 mg total) by mouth every 8 (eight) hours as needed for  muscle spasms. ?Patient not taking: Reported on 08/20/2021 07/17/21   Zenia ResidesBanister, Pamela K, MD  ? ? ?Family History ?Family History  ?Problem Relation Age of Onset  ? Hypertension Mother   ? Healthy Father   ? ? ?Social History ?Social History  ? ?Tobacco Use  ? Smoking status: Every Day  ?  Packs/day: 1.00  ?  Years: 25.00  ?  Pack years: 25.00  ?  Types: Cigarettes  ? Smokeless tobacco: Never  ?Vaping Use  ? Vaping Use: Never used  ?Substance Use Topics  ? Alcohol use: Yes  ? Drug use: No  ? ? ? ?Allergies   ?Patient has no known allergies. ? ? ?Review of Systems ?Review of Systems  ?Constitutional:  Positive for appetite change, chills, fatigue and fever.  ?HENT:  Positive for sore throat, trouble swallowing and voice change. Negative for congestion, ear pain, postnasal drip and rhinorrhea.   ?Eyes: Negative.   ?Respiratory: Negative.    ?Cardiovascular: Negative.   ?Gastrointestinal: Negative.   ?Skin: Negative.   ?Psychiatric/Behavioral: Negative.    ? ? ?Physical Exam ?Triage Vital Signs ?ED Triage Vitals  ?Enc Vitals Group  ?   BP 08/20/21 1411 133/79  ?   Pulse Rate 08/20/21 1409 98  ?   Resp 08/20/21 1409 (!) 22  ?   Temp 08/20/21  1409 98.5 ?F (36.9 ?C)  ?   Temp src --   ?   SpO2 08/20/21 1409 98 %  ?   Weight --   ?   Height --   ?   Head Circumference --   ?   Peak Flow --   ?   Pain Score 08/20/21 1407 6  ?   Pain Loc --   ?   Pain Edu? --   ?   Excl. in [place]? --   ? ?No data found. ? ?Updated Vital Signs ?BP 133/79   Pulse 98   Temp 98.5 ?F (36.9 ?C)   Resp (!) 22   SpO2 98%  ? ?Visual Acuity ?Right Eye Distance:   ?Left Eye Distance:   ?Bilateral Distance:   ? ?Right Eye Near:   ?Left Eye Near:    ?Bilateral Near:    ? ?Physical Exam ?Vitals reviewed.  ?Constitutional:   ?   General: He is not in acute distress. ?   Appearance: He is well-developed.  ?HENT:  ?   Head: Normocephalic and atraumatic.  ?   Right Ear: Tympanic membrane and ear canal normal.  ?   Left Ear: Tympanic membrane and ear canal  normal.  ?   Nose: No congestion or rhinorrhea.  ?   Mouth/Throat:  ?   Mouth: Mucous membranes are moist.  ?   Pharynx: Pharyngeal swelling, posterior oropharyngeal erythema and uvula swelling present.  ?   Tonsils: No tonsillar exudate. 2+ on the right. 2+ on the left.  ?Eyes:  ?   Conjunctiva/sclera: Conjunctivae normal.  ?   Pupils: Pupils are equal, round, and reactive to light.  ?Cardiovascular:  ?   Rate and Rhythm: Normal rate and regular rhythm.  ?   Heart sounds: Normal heart sounds.  ?Pulmonary:  ?   Effort: Pulmonary effort is normal.  ?   Breath sounds: Normal breath sounds.  ?Abdominal:  ?   General: Bowel sounds are normal.  ?   Palpations: Abdomen is soft.  ?Musculoskeletal:  ?   Cervical back: Normal range of motion and neck supple.  ?Lymphadenopathy:  ?   Cervical: Cervical adenopathy present.  ?Skin: ?   General: Skin is warm and dry.  ?   Capillary Refill: Capillary refill takes less than 2 seconds.  ?Neurological:  ?   General: No focal deficit present.  ?   Mental Status: He is alert and oriented to person, place, and time.  ?Psychiatric:     ?   Mood and Affect: Mood normal.     ?   Behavior: Behavior normal.  ? ? ? ?UC Treatments / Results  ?Labs ?(all labs ordered are listed, but only abnormal results are displayed) ?Labs Reviewed  ?POCT RAPID STREP A, ED / UC - Abnormal; Notable for the following components:  ?    Result Value  ? Streptococcus, Group A Screen (Direct) POSITIVE (*)   ? All other components within normal limits  ?POC INFLUENZA A AND B ANTIGEN (URGENT CARE ONLY)  ? ? ?EKG ? ? ?Radiology ?No results found. ? ?Procedures ?Procedures (including critical care time) ? ?Medications Ordered in UC ?Medications - No data to display ? ?Initial Impression / Assessment and Plan / UC Course  ?I have reviewed the triage vital signs and the nursing notes. ? ?Pertinent labs & imaging results that were available during my care of the patient were reviewed by me and considered in my medical  decision making (  see chart for details). ? ?The patient is a 46 year old male who presents with sore throat.  Symptoms have been present for the past 2 to 3 days.  He complains of fever, chills, hoarseness.  He also reports that he feels like he has increased fatigue.  On exam, he has moderate tonsil swelling, with cervical adenopathy.  Rapid strep test is positive.  We will treat the patient with penicillin.  We will also provide symptomatic treatment to include ibuprofen and viscous lidocaine.  Supportive care at home to include increasing fluids and getting plenty of rest.  Warm salt water gargles 3-4 times daily until symptoms improve.  Also recommended a soft diet for the patient until his throat pain improves.  Patient advised to follow-up if symptoms do not improve. ?Final Clinical Impressions(s) / UC Diagnoses  ? ?Final diagnoses:  ?Strep throat  ? ? ? ?Discharge Instructions   ? ?  ?Your strep test is positive today.  Your influenza test is negative. ?Take medication as prescribed.  Take the ibuprofen with food and water. ?I recommend a soft diet until your throat symptoms improve to include soups, broths, yogurt, or Jell-O. ?Warm salt water gargles 3-4 times daily until your symptoms improve. ?Rest your voice until your symptoms improve. ?Follow-up if symptoms do not improve. ? ? ? ? ? ?ED Prescriptions   ? ? Medication Sig Dispense Auth. Provider  ? penicillin v potassium (VEETID) 500 MG tablet Take 1 tablet (500 mg total) by mouth 3 (three) times daily for 10 days. 30 tablet Kristine Chahal-Warren, Sadie Haber, NP  ? ibuprofen (ADVIL) 800 MG tablet Take 1 tablet (800 mg total) by mouth every 8 (eight) hours as needed. 20 tablet Dazia Lippold-Warren, Sadie Haber, NP  ? lidocaine (XYLOCAINE) 2 % solution Use as directed 5 mLs in the mouth or throat every 4 (four) hours as needed for mouth pain. 100 mL Lynnsie Linders-Warren, Sadie Haber, NP  ? ?  ? ?PDMP not reviewed this encounter. ?  ?Abran Cantor, NP ?08/20/21 1441 ? ?

## 2021-08-20 NOTE — Discharge Instructions (Addendum)
Your strep test is positive today.  Your influenza test is negative. ?Take medication as prescribed.  Take the ibuprofen with food and water. ?I recommend a soft diet until your throat symptoms improve to include soups, broths, yogurt, or Jell-O. ?Warm salt water gargles 3-4 times daily until your symptoms improve. ?Rest your voice until your symptoms improve. ?Follow-up if symptoms do not improve. ? ?

## 2021-08-20 NOTE — ED Triage Notes (Signed)
Patient presents to Urgent Care with complaints of sore throat and hoarseness, difficulty swallowing, fevers and chills since Monday. Patient reports no otc medications.  ? ?

## 2021-09-02 ENCOUNTER — Encounter (HOSPITAL_BASED_OUTPATIENT_CLINIC_OR_DEPARTMENT_OTHER): Payer: Self-pay

## 2021-09-02 ENCOUNTER — Other Ambulatory Visit: Payer: Self-pay

## 2021-09-02 ENCOUNTER — Emergency Department (HOSPITAL_BASED_OUTPATIENT_CLINIC_OR_DEPARTMENT_OTHER)
Admission: EM | Admit: 2021-09-02 | Discharge: 2021-09-03 | Disposition: A | Discharge status: Home / Self Care | Payer: Medicaid Other | Attending: Emergency Medicine | Admitting: Emergency Medicine

## 2021-09-02 DIAGNOSIS — R590 Localized enlarged lymph nodes: Secondary | ICD-10-CM

## 2021-09-02 DIAGNOSIS — J020 Streptococcal pharyngitis: Secondary | ICD-10-CM | POA: Diagnosis not present

## 2021-09-02 DIAGNOSIS — D72829 Elevated white blood cell count, unspecified: Secondary | ICD-10-CM | POA: Insufficient documentation

## 2021-09-02 DIAGNOSIS — R070 Pain in throat: Secondary | ICD-10-CM | POA: Diagnosis present

## 2021-09-02 MED ORDER — DEXAMETHASONE SODIUM PHOSPHATE 10 MG/ML IJ SOLN
10.0000 mg | Freq: Once | INTRAMUSCULAR | Status: AC
  Administered 2021-09-03: 10 mg via INTRAVENOUS
  Filled 2021-09-02: qty 1

## 2021-09-02 MED ORDER — SODIUM CHLORIDE 0.9 % IV BOLUS
1000.0000 mL | Freq: Once | INTRAVENOUS | Status: AC
  Administered 2021-09-03: 1000 mL via INTRAVENOUS

## 2021-09-02 MED ORDER — SODIUM CHLORIDE 0.9 % IV SOLN
3.0000 g | Freq: Once | INTRAVENOUS | Status: AC
  Administered 2021-09-03: 3 g via INTRAVENOUS

## 2021-09-02 NOTE — ED Triage Notes (Signed)
Patient here POV from Home with Sore Throat. ? ?Endorses Sore Throat for approximately 2 weeks associated with Congestion, Left Ear Pain.  ? ?No Cough. No Known Fevers.  ? ?Seen by UC for Same on 04/20 and diagnosed with Strep Throat. Given Antibiotics and instructed to return if Symptoms did not subside.  ? ?NAD Noted during Triage. A&Ox4. GCS 15. Ambualtory.  ?

## 2021-09-02 NOTE — ED Provider Notes (Signed)
?[facility] DEPT ?Provider Note ? ? ?CSN: [id number] ?Arrival date & time: 09/02/21  2110 ? ?  ? ?History ? ?Chief Complaint  ?Patient presents with  ? Sore Throat  ? ? ?Edward Klein is a 46 y.o. male. ? ?Patient is a 46 year old male with no significant past medical history.  Patient presenting today with complaints of throat pain.  This has been present for the past 2 weeks.  Patient seen at that time at an urgent care clinic.  He was prescribed penicillin for what was thought to be strep throat, however this is not helping.  He describes pain to the left side of his neck radiating to his left ear.  This makes it difficult for him to eat or swallow.  He reports fevers at home and feeling chilled and is febrile here with a temp of 100.2.  He denies ill contacts. ? ?The history is provided by the patient.  ? ?  ? ?Home Medications ?Prior to Admission medications   ?Medication Sig Start Date End Date Taking? Authorizing Provider  ?ibuprofen (ADVIL) 800 MG tablet Take 1 tablet (800 mg total) by mouth every 8 (eight) hours as needed. 08/20/21   Leath-Warren, Alda Lea, NP  ?lidocaine (XYLOCAINE) 2 % solution Use as directed 5 mLs in the mouth or throat every 4 (four) hours as needed for mouth pain. 08/20/21   Leath-Warren, Alda Lea, NP  ?naproxen (NAPROSYN) 500 MG tablet Take 1 tablet (500 mg total) by mouth 2 (two) times daily. ?Patient not taking: Reported on 08/20/2021 07/17/21   Barrett Henle, MD  ?tiZANidine (ZANAFLEX) 4 MG tablet Take 1 tablet (4 mg total) by mouth every 8 (eight) hours as needed for muscle spasms. ?Patient not taking: Reported on 08/20/2021 07/17/21   Barrett Henle, MD  ?   ? ?Allergies    ?Patient has no known allergies.   ? ?Review of Systems   ?Review of Systems  ?All other systems reviewed and are negative. ? ?Physical Exam ?Updated Vital Signs ?BP (!) 149/95 (BP Location: Left Arm)   Pulse (!) 110   Temp 100.2 ?F (37.9 ?C) (Oral)   Resp 18   Ht 5\' 10"   (1.778 m)   Wt 113.4 kg   SpO2 100%   BMI 35.87 kg/m?  ?Physical Exam ?Vitals and nursing note reviewed.  ?Constitutional:   ?   General: He is not in acute distress. ?   Appearance: He is well-developed. He is not diaphoretic.  ?HENT:  ?   Head: Normocephalic and atraumatic.  ?   Right Ear: Tympanic membrane normal.  ?   Left Ear: Tympanic membrane normal.  ?   Nose: No congestion or rhinorrhea.  ?   Mouth/Throat:  ?   Tonsils: Tonsillar abscess present. No tonsillar exudate. 1+ on the left.  ?Cardiovascular:  ?   Rate and Rhythm: Normal rate and regular rhythm.  ?   Heart sounds: No murmur heard. ?  No friction rub.  ?Pulmonary:  ?   Effort: Pulmonary effort is normal. No respiratory distress.  ?   Breath sounds: Normal breath sounds. No wheezing or rales.  ?Abdominal:  ?   General: Bowel sounds are normal. There is no distension.  ?   Palpations: Abdomen is soft.  ?   Tenderness: There is no abdominal tenderness.  ?Musculoskeletal:     ?   General: Normal range of motion.  ?   Cervical back: Normal range of motion and neck supple.  ?  Lymphadenopathy:  ?   Cervical: Cervical adenopathy present.  ?Skin: ?   General: Skin is warm and dry.  ?Neurological:  ?   Mental Status: He is alert and oriented to person, place, and time.  ?   Coordination: Coordination normal.  ? ? ?ED Results / Procedures / Treatments   ?Labs ?(all labs ordered are listed, but only abnormal results are displayed) ?Labs Reviewed - No data to display ? ?EKG ?None ? ?Radiology ?No results found. ? ?Procedures ?Procedures  ? ? ?Medications Ordered in ED ?Medications  ?sodium chloride 0.9 % bolus 1,000 mL (has no administration in time range)  ?dexamethasone (DECADRON) injection 10 mg (has no administration in time range)  ?Ampicillin-Sulbactam (UNASYN) 3 g in sodium chloride 0.9 % 100 mL IVPB (has no administration in time range)  ? ? ?ED Course/ Medical Decision Making/ A&P ? ?This patient presents to the ED for concern of sore throat and neck  pain, this involves an extensive number of treatment options, and is a complaint that carries with it a high risk of complications and morbidity.  The differential diagnosis includes strep pharyngitis, peritonsillar abscess, reactive adenopathy, Ludwick's angina ? ? ?Co morbidities that complicate the patient evaluation ? ?None ? ? ?Additional history obtained: ? ?No additional history or external records needed ? ? ?Lab Tests: ? ?I Ordered, and personally interpreted labs.  The pertinent results include: Leukocytosis with white count of 20,000 and strep test is positive, but laboratory studies otherwise unremarkable ? ? ?Imaging Studies ordered: ? ?I ordered imaging studies including CT of the neck/soft tissue ?I independently visualized and interpreted imaging which showed tonsillitis and reactive adenopathy, however no definitive abscess. ?I agree with the radiologist interpretation ? ? ?Cardiac Monitoring: / EKG: ? ?No EKG indicated ? ? ?Consultations Obtained: ? ?No consultations obtained ? ? ?Problem List / ED Course / Critical interventions / Medication management ? ?Patient presenting with persistent sore throat.  He was treated 1 week ago with amoxicillin for presumed strep, however this did not help.  Patient strep test is still positive and throat still appears inflamed.  I did obtain a CT scan to rule out abscess.  There was no abscess, but did show reactive adenopathy and tonsillitis.  Patient given IV steroids, pain medication, and Unasyn.  He will be discharged with Augmentin, prednisone, and medication for pain.  Patient is feeling somewhat better after receiving the above medications. ?I have reviewed the patients home medicines and have made adjustments as needed ? ? ?Social Determinants of Health: ? ?None ? ? ?Test / Admission - Considered: ? ?Patient to be discharged to home.  There are no airway issues or other indications for admission. ? ? ?Final Clinical Impression(s) / ED Diagnoses ?Final  diagnoses:  ?None  ? ? ?Rx / DC Orders ?ED Discharge Orders   ? ? None  ? ?  ? ? ?  ?Veryl Speak, MD ?09/03/21 0208 ? ?

## 2021-09-03 ENCOUNTER — Emergency Department (HOSPITAL_BASED_OUTPATIENT_CLINIC_OR_DEPARTMENT_OTHER): Payer: Medicaid Other

## 2021-09-03 ENCOUNTER — Encounter (HOSPITAL_BASED_OUTPATIENT_CLINIC_OR_DEPARTMENT_OTHER): Payer: Self-pay | Admitting: Radiology

## 2021-09-03 LAB — BASIC METABOLIC PANEL
Anion gap: 10 (ref 5–15)
BUN: 15 mg/dL (ref 6–20)
CO2: 26 mmol/L (ref 22–32)
Calcium: 10.2 mg/dL (ref 8.9–10.3)
Chloride: 100 mmol/L (ref 98–111)
Creatinine, Ser: 0.91 mg/dL (ref 0.61–1.24)
GFR, Estimated: >60 mL/min (ref >60)
Glucose, Bld: 96 mg/dL (ref 70–99)
Potassium: 3.8 mmol/L (ref 3.5–5.1)
Sodium: 136 mmol/L (ref 135–145)

## 2021-09-03 LAB — CBC WITH DIFFERENTIAL/PLATELET
Abs Immature Granulocytes: 0.08 10*3/uL — ABNORMAL HIGH (ref 0.00–0.07)
Basophils Absolute: 0.1 10*3/uL (ref 0.0–0.1)
Basophils Relative: 0 %
Eosinophils Absolute: 0 10*3/uL (ref 0.0–0.5)
Eosinophils Relative: 0 %
HCT: 50.1 % (ref 39.0–52.0)
Hemoglobin: 16.4 g/dL (ref 13.0–17.0)
Immature Granulocytes: 0 %
Lymphocytes Relative: 12 %
Lymphs Abs: 2.5 10*3/uL (ref 0.7–4.0)
MCH: 26.6 pg (ref 26.0–34.0)
MCHC: 32.7 g/dL (ref 30.0–36.0)
MCV: 81.3 fL (ref 80.0–100.0)
Monocytes Absolute: 0.9 10*3/uL (ref 0.1–1.0)
Monocytes Relative: 4 %
Neutro Abs: 16.6 10*3/uL — ABNORMAL HIGH (ref 1.7–7.7)
Neutrophils Relative %: 84 %
Platelets: 337 10*3/uL (ref 150–400)
RBC: 6.16 MIL/uL — ABNORMAL HIGH (ref 4.22–5.81)
RDW: 13.2 % (ref 11.5–15.5)
WBC: 20.1 10*3/uL — ABNORMAL HIGH (ref 4.0–10.5)
nRBC: 0 % (ref 0.0–0.2)

## 2021-09-03 LAB — MONONUCLEOSIS SCREEN: Mono Screen: NEGATIVE

## 2021-09-03 LAB — GROUP A STREP BY PCR: Group A Strep by PCR: DETECTED — AB

## 2021-09-03 MED ORDER — AMOXICILLIN-POT CLAVULANATE 500-125 MG PO TABS: 1.0000 | ORAL_TABLET | Freq: Three times a day (TID) | ORAL | 0 refills | Status: DC

## 2021-09-03 MED ORDER — HYDROCODONE-ACETAMINOPHEN 5-325 MG PO TABS: 1.0000 | ORAL_TABLET | Freq: Four times a day (QID) | ORAL | 0 refills | Status: DC | PRN

## 2021-09-03 MED ORDER — PREDNISONE 10 MG PO TABS: 20.0000 mg | ORAL_TABLET | Freq: Two times a day (BID) | ORAL | 0 refills | Status: DC

## 2021-09-03 MED ORDER — MORPHINE SULFATE (PF) 4 MG/ML IV SOLN
4.0000 mg | Freq: Once | INTRAVENOUS | Status: AC
  Administered 2021-09-03: 4 mg via INTRAVENOUS
  Filled 2021-09-03: qty 1

## 2021-09-03 MED ORDER — IOHEXOL 300 MG/ML  SOLN
100.0000 mL | Freq: Once | INTRAMUSCULAR | Status: AC | PRN
  Administered 2021-09-03: 75 mL via INTRAVENOUS

## 2021-09-03 NOTE — Discharge Instructions (Signed)
Begin taking Augmentin and prednisone as prescribed. ? ?Take hydrocodone as prescribed as needed for pain. ? ?Return to the emergency department if you develop worsening pain, difficulty breathing, and inability to swallow, or for other new and concerning symptoms. ?

## 2021-09-07 ENCOUNTER — Inpatient Hospital Stay (HOSPITAL_BASED_OUTPATIENT_CLINIC_OR_DEPARTMENT_OTHER)
Admission: EM | Admit: 2021-09-07 | Discharge: 2021-09-10 | DRG: 145 | Disposition: A | Discharge status: Home / Self Care | Payer: Medicaid Other | Attending: Internal Medicine | Admitting: Internal Medicine

## 2021-09-07 ENCOUNTER — Encounter (HOSPITAL_BASED_OUTPATIENT_CLINIC_OR_DEPARTMENT_OTHER): Payer: Self-pay

## 2021-09-07 ENCOUNTER — Other Ambulatory Visit: Payer: Self-pay

## 2021-09-07 DIAGNOSIS — Z6834 Body mass index (BMI) 34.0-34.9, adult: Secondary | ICD-10-CM

## 2021-09-07 DIAGNOSIS — J391 Other abscess of pharynx: Secondary | ICD-10-CM

## 2021-09-07 DIAGNOSIS — E041 Nontoxic single thyroid nodule: Secondary | ICD-10-CM | POA: Diagnosis present

## 2021-09-07 DIAGNOSIS — J392 Other diseases of pharynx: Principal | ICD-10-CM | POA: Diagnosis present

## 2021-09-07 DIAGNOSIS — Z7952 Long term (current) use of systemic steroids: Secondary | ICD-10-CM

## 2021-09-07 DIAGNOSIS — Z8619 Personal history of other infectious and parasitic diseases: Secondary | ICD-10-CM

## 2021-09-07 DIAGNOSIS — E669 Obesity, unspecified: Secondary | ICD-10-CM | POA: Diagnosis present

## 2021-09-07 DIAGNOSIS — D75839 Thrombocytosis, unspecified: Secondary | ICD-10-CM | POA: Diagnosis present

## 2021-09-07 DIAGNOSIS — Z79899 Other long term (current) drug therapy: Secondary | ICD-10-CM

## 2021-09-07 DIAGNOSIS — J387 Other diseases of larynx: Principal | ICD-10-CM

## 2021-09-07 DIAGNOSIS — F17210 Nicotine dependence, cigarettes, uncomplicated: Secondary | ICD-10-CM | POA: Diagnosis present

## 2021-09-07 MED ORDER — LACTATED RINGERS IV BOLUS
1000.0000 mL | Freq: Once | INTRAVENOUS | Status: AC
Start: 2021-09-08 — End: 2021-09-08
  Administered 2021-09-08: 1000 mL via INTRAVENOUS

## 2021-09-07 MED ORDER — DEXAMETHASONE SODIUM PHOSPHATE 10 MG/ML IJ SOLN
10.0000 mg | Freq: Once | INTRAMUSCULAR | Status: AC
  Administered 2021-09-08: 10 mg via INTRAVENOUS
  Filled 2021-09-07: qty 1

## 2021-09-07 NOTE — ED Provider Notes (Signed)
? ?[facility] EM[facility] DEPT  ?Provider Note ? ?CSN: [id number] ?Arrival date & time: 09/07/21 2058 ? ?History ?Chief Complaint  ?Patient presents with  ? Neck Pain  ? ? ?Edward Klein is a 46 y.o. male recent seen in the ED for sore throat, diagnosed with strep and given Rx for augmentin, steroids and pain meds. He was doing better but pain returned earlier today, worse on the left.. Worse with swallowing. No fever. He has lost his voice, having trouble opening his mouth.  ? ? ?Home Medications ?Prior to Admission medications   ?Medication Sig Start Date End Date Taking? Authorizing Provider  ?amoxicillin-clavulanate (AUGMENTIN) 500-125 MG tablet Take 1 tablet (500 mg total) by mouth every 8 (eight) hours. 09/03/21   Veryl Speak, MD  ?HYDROcodone-acetaminophen (NORCO) 5-325 MG tablet Take 1-2 tablets by mouth every 6 (six) hours as needed. 09/03/21   Veryl Speak, MD  ?ibuprofen (ADVIL) 800 MG tablet Take 1 tablet (800 mg total) by mouth every 8 (eight) hours as needed. 08/20/21   Leath-Warren, Alda Lea, NP  ?lidocaine (XYLOCAINE) 2 % solution Use as directed 5 mLs in the mouth or throat every 4 (four) hours as needed for mouth pain. 08/20/21   Leath-Warren, Alda Lea, NP  ?naproxen (NAPROSYN) 500 MG tablet Take 1 tablet (500 mg total) by mouth 2 (two) times daily. ?Patient not taking: Reported on 08/20/2021 07/17/21   Barrett Henle, MD  ?predniSONE (DELTASONE) 10 MG tablet Take 2 tablets (20 mg total) by mouth 2 (two) times daily with a meal. 09/03/21   Veryl Speak, MD  ?tiZANidine (ZANAFLEX) 4 MG tablet Take 1 tablet (4 mg total) by mouth every 8 (eight) hours as needed for muscle spasms. ?Patient not taking: Reported on 08/20/2021 07/17/21   Barrett Henle, MD  ? ? ? ?Allergies    ?Patient has no known allergies. ? ? ?Review of Systems   ?Review of Systems ?Please see HPI for pertinent positives and negatives ? ?Physical Exam ?BP 121/86   Pulse 72   Temp 97.8 ?F (36.6 ?C) (Oral)   Resp 18    Ht 5\' 10"  (1.778 m)   Wt 107.5 kg   SpO2 98%   BMI 34.01 kg/m?  ? ?Physical Exam ?Vitals and nursing note reviewed.  ?Constitutional:   ?   Appearance: Normal appearance.  ?HENT:  ?   Head: Normocephalic and atraumatic.  ?   Nose: Nose normal.  ?   Mouth/Throat:  ?   Mouth: Mucous membranes are moist.  ?   Comments: Mild trismus, tonsils are 2+ on the left and 1+ on the right, uvula is midline ?Eyes:  ?   Extraocular Movements: Extraocular movements intact.  ?   Conjunctiva/sclera: Conjunctivae normal.  ?Cardiovascular:  ?   Rate and Rhythm: Normal rate.  ?Pulmonary:  ?   Effort: Pulmonary effort is normal.  ?   Breath sounds: Normal breath sounds.  ?Abdominal:  ?   General: Abdomen is flat.  ?   Palpations: Abdomen is soft.  ?   Tenderness: There is no abdominal tenderness.  ?Musculoskeletal:     ?   General: No swelling. Normal range of motion.  ?   Cervical back: Neck supple.  ?Lymphadenopathy:  ?   Cervical: Cervical adenopathy present.  ?Skin: ?   General: Skin is warm and dry.  ?Neurological:  ?   General: No focal deficit present.  ?   Mental Status: He is alert.  ?Psychiatric:     ?  Mood and Affect: Mood normal.  ? ? ?ED Results / Procedures / Treatments   ?EKG ?None ? ?Procedures ?Procedures ? ?Medications Ordered in the ED ?Medications  ?lactated ringers infusion (has no administration in time range)  ?fentaNYL (SUBLIMAZE) injection 50 mcg (has no administration in time range)  ?lactated ringers bolus 1,000 mL (0 mLs Intravenous Stopped 09/08/21 0212)  ?dexamethasone (DECADRON) injection 10 mg (10 mg Intravenous Given 09/08/21 0010)  ?iohexol (OMNIPAQUE) 300 MG/ML solution 75 mL (100 mLs Intravenous Contrast Given 09/08/21 0015)  ?Ampicillin-Sulbactam (UNASYN) 3 g in sodium chloride 0.9 % 100 mL IVPB (0 g Intravenous Stopped 09/08/21 0258)  ? ? ?Initial Impression and Plan ? Patient with worsening sore throat, trismus and hoarse voice after recent strep. Will check labs and send for CT to rule out abscess  or deep tissue infection. Decadron for swelling.  ? ?ED Course  ? ?Clinical Course as of 09/08/21 0305  ?Tue Sep 08, 2021  ?0029 CBC with leukocytosis, has been on steroids [CS]  ?0105 BMP is normal.  [CS]  ?0211 I personally viewed the images from radiology studies and agree with radiologist interpretation: CT shows development of an abscess in surpaglottic/parapharyngeal space near hyoid bone. Will discuss with ENT. Begin Unasyn IV.  ? [CS]  ?Thatcher with Dr. Redmond Baseman, ENT, who requests the patient be admitted to the Hospitalist service for Abx and he will evaluate for operative drainage.  [CS]  ?Delius.Fowler Spoke with Dr. Marlyce Huge, Hospitalist, who will accept for admission.  [CS]  ?  ?Clinical Course User Index ?[CS] Truddie Hidden, MD  ? ? ? ?MDM Rules/Calculators/A&P ?Medical Decision Making ?Problems Addressed: ?Supraglottic abscess: acute illness or injury ? ?Amount and/or Complexity of Data Reviewed ?Labs: ordered. Decision-making details documented in ED Course. ?Radiology: ordered and independent interpretation performed. Decision-making details documented in ED Course. ? ?Risk ?Prescription drug management. ?Parenteral controlled substances. ?Decision regarding hospitalization. ? ? ? ?Final Clinical Impression(s) / ED Diagnoses ?Final diagnoses:  ?Supraglottic abscess  ? ? ?Rx / DC Orders ?ED Discharge Orders   ? ? None  ? ?  ? ?  ?Truddie Hidden, MD ?09/08/21 [phone] ? ?

## 2021-09-07 NOTE — ED Triage Notes (Signed)
Dx with strep throat recently, last antibiotic last night. Now c/o left sided neck pain up to his left ear and difficulty swallowing ?

## 2021-09-08 ENCOUNTER — Encounter (HOSPITAL_COMMUNITY): Payer: Self-pay | Admitting: Family Medicine

## 2021-09-08 ENCOUNTER — Encounter (HOSPITAL_COMMUNITY)
Admission: EM | Disposition: A | Discharge status: Home / Self Care | Payer: Self-pay | Source: Home / Self Care | Attending: Internal Medicine

## 2021-09-08 ENCOUNTER — Inpatient Hospital Stay (HOSPITAL_COMMUNITY): Payer: Medicaid Other | Admitting: Anesthesiology

## 2021-09-08 ENCOUNTER — Emergency Department (HOSPITAL_BASED_OUTPATIENT_CLINIC_OR_DEPARTMENT_OTHER): Payer: Medicaid Other

## 2021-09-08 DIAGNOSIS — J391 Other abscess of pharynx: Secondary | ICD-10-CM

## 2021-09-08 DIAGNOSIS — E669 Obesity, unspecified: Secondary | ICD-10-CM | POA: Diagnosis present

## 2021-09-08 DIAGNOSIS — D75839 Thrombocytosis, unspecified: Secondary | ICD-10-CM | POA: Diagnosis present

## 2021-09-08 DIAGNOSIS — Z7952 Long term (current) use of systemic steroids: Secondary | ICD-10-CM | POA: Diagnosis not present

## 2021-09-08 DIAGNOSIS — Z8619 Personal history of other infectious and parasitic diseases: Secondary | ICD-10-CM | POA: Diagnosis not present

## 2021-09-08 DIAGNOSIS — Z79899 Other long term (current) drug therapy: Secondary | ICD-10-CM | POA: Diagnosis not present

## 2021-09-08 DIAGNOSIS — Z6834 Body mass index (BMI) 34.0-34.9, adult: Secondary | ICD-10-CM | POA: Diagnosis not present

## 2021-09-08 DIAGNOSIS — E041 Nontoxic single thyroid nodule: Secondary | ICD-10-CM | POA: Diagnosis present

## 2021-09-08 DIAGNOSIS — F17210 Nicotine dependence, cigarettes, uncomplicated: Secondary | ICD-10-CM | POA: Diagnosis present

## 2021-09-08 DIAGNOSIS — J392 Other diseases of pharynx: Secondary | ICD-10-CM | POA: Diagnosis present

## 2021-09-08 DIAGNOSIS — J387 Other diseases of larynx: Secondary | ICD-10-CM | POA: Diagnosis not present

## 2021-09-08 HISTORY — PX: INCISION AND DRAINAGE ABSCESS: SHX5864

## 2021-09-08 LAB — CBC WITH DIFFERENTIAL/PLATELET
Abs Immature Granulocytes: 0.12 10*3/uL — ABNORMAL HIGH (ref 0.00–0.07)
Basophils Absolute: 0.1 10*3/uL (ref 0.0–0.1)
Basophils Relative: 0 %
Eosinophils Absolute: 0.1 10*3/uL (ref 0.0–0.5)
Eosinophils Relative: 1 %
HCT: 47.8 % (ref 39.0–52.0)
Hemoglobin: 15.6 g/dL (ref 13.0–17.0)
Immature Granulocytes: 1 %
Lymphocytes Relative: 34 %
Lymphs Abs: 6.2 10*3/uL — ABNORMAL HIGH (ref 0.7–4.0)
MCH: 26.8 pg (ref 26.0–34.0)
MCHC: 32.6 g/dL (ref 30.0–36.0)
MCV: 82.1 fL (ref 80.0–100.0)
Monocytes Absolute: 1.4 10*3/uL — ABNORMAL HIGH (ref 0.1–1.0)
Monocytes Relative: 8 %
Neutro Abs: 10.5 10*3/uL — ABNORMAL HIGH (ref 1.7–7.7)
Neutrophils Relative %: 56 %
Platelets: 435 10*3/uL — ABNORMAL HIGH (ref 150–400)
RBC: 5.82 MIL/uL — ABNORMAL HIGH (ref 4.22–5.81)
RDW: 13.3 % (ref 11.5–15.5)
WBC: 18.4 10*3/uL — ABNORMAL HIGH (ref 4.0–10.5)
nRBC: 0 % (ref 0.0–0.2)

## 2021-09-08 LAB — BASIC METABOLIC PANEL
Anion gap: 9 (ref 5–15)
BUN: 20 mg/dL (ref 6–20)
CO2: 28 mmol/L (ref 22–32)
Calcium: 9.4 mg/dL (ref 8.9–10.3)
Chloride: 102 mmol/L (ref 98–111)
Creatinine, Ser: 1.04 mg/dL (ref 0.61–1.24)
GFR, Estimated: >60 mL/min (ref >60)
Glucose, Bld: 89 mg/dL (ref 70–99)
Potassium: 3.6 mmol/L (ref 3.5–5.1)
Sodium: 139 mmol/L (ref 135–145)

## 2021-09-08 LAB — SURGICAL PCR SCREEN
MRSA, PCR: NEGATIVE
Staphylococcus aureus: NEGATIVE

## 2021-09-08 SURGERY — INCISION AND DRAINAGE, ABSCESS
Anesthesia: General | Laterality: Left

## 2021-09-08 MED ORDER — SUGAMMADEX SODIUM 200 MG/2ML IV SOLN
INTRAVENOUS | Status: DC | PRN
  Administered 2021-09-08: 200 mg via INTRAVENOUS

## 2021-09-08 MED ORDER — MUPIROCIN 2 % EX OINT
1.0000 "application " | TOPICAL_OINTMENT | Freq: Two times a day (BID) | CUTANEOUS | Status: DC
  Administered 2021-09-08 – 2021-09-10 (×4): 1 via NASAL
  Filled 2021-09-08: qty 22

## 2021-09-08 MED ORDER — ACETAMINOPHEN 650 MG RE SUPP
650.0000 mg | Freq: Four times a day (QID) | RECTAL | Status: DC | PRN
Start: 2021-09-08 — End: 2021-09-10

## 2021-09-08 MED ORDER — PROPOFOL 10 MG/ML IV BOLUS
INTRAVENOUS | Status: AC
  Filled 2021-09-08: qty 20

## 2021-09-08 MED ORDER — LIDOCAINE 2% (20 MG/ML) 5 ML SYRINGE
INTRAMUSCULAR | Status: DC | PRN
Start: 2021-09-08 — End: 2021-09-08
  Administered 2021-09-08: 100 mg via INTRAVENOUS

## 2021-09-08 MED ORDER — ACETAMINOPHEN 325 MG PO TABS
650.0000 mg | ORAL_TABLET | Freq: Four times a day (QID) | ORAL | Status: DC | PRN
  Administered 2021-09-09 – 2021-09-10 (×2): 650 mg via ORAL
  Filled 2021-09-08 (×2): qty 2

## 2021-09-08 MED ORDER — AMPICILLIN-SULBACTAM SODIUM 3 (2-1) G IJ SOLR
3.0000 g | Freq: Once | INTRAMUSCULAR | Status: AC
  Administered 2021-09-08: 3 g via INTRAVENOUS

## 2021-09-08 MED ORDER — ONDANSETRON HCL 4 MG/2ML IJ SOLN
INTRAMUSCULAR | Status: DC | PRN
  Administered 2021-09-08: 4 mg via INTRAVENOUS

## 2021-09-08 MED ORDER — FENTANYL CITRATE (PF) 250 MCG/5ML IJ SOLN
INTRAMUSCULAR | Status: AC
  Filled 2021-09-08: qty 5

## 2021-09-08 MED ORDER — BACITRACIN ZINC 500 UNIT/GM EX OINT
TOPICAL_OINTMENT | CUTANEOUS | Status: AC
  Filled 2021-09-08: qty 28.35

## 2021-09-08 MED ORDER — ROCURONIUM BROMIDE 10 MG/ML (PF) SYRINGE
PREFILLED_SYRINGE | INTRAVENOUS | Status: AC
  Filled 2021-09-08: qty 10

## 2021-09-08 MED ORDER — SODIUM CHLORIDE 0.9 % IV SOLN
3.0000 g | Freq: Four times a day (QID) | INTRAVENOUS | Status: DC
  Administered 2021-09-08 – 2021-09-10 (×10): 3 g via INTRAVENOUS
  Filled 2021-09-08 (×13): qty 8

## 2021-09-08 MED ORDER — 0.9 % SODIUM CHLORIDE (POUR BTL) OPTIME
TOPICAL | Status: DC | PRN
  Administered 2021-09-08: 1000 mL

## 2021-09-08 MED ORDER — LIDOCAINE-EPINEPHRINE 1 %-1:100000 IJ SOLN
INTRAMUSCULAR | Status: AC
  Filled 2021-09-08: qty 1

## 2021-09-08 MED ORDER — CHLORHEXIDINE GLUCONATE 0.12 % MT SOLN: 15.0000 mL | Freq: Once | OROMUCOSAL | Status: AC

## 2021-09-08 MED ORDER — MIDAZOLAM HCL 2 MG/2ML IJ SOLN
INTRAMUSCULAR | Status: AC
  Filled 2021-09-08: qty 2

## 2021-09-08 MED ORDER — DEXAMETHASONE SODIUM PHOSPHATE 10 MG/ML IJ SOLN
INTRAMUSCULAR | Status: DC | PRN
  Administered 2021-09-08: 5 mg via INTRAVENOUS

## 2021-09-08 MED ORDER — SUCCINYLCHOLINE CHLORIDE 200 MG/10ML IV SOSY
PREFILLED_SYRINGE | INTRAVENOUS | Status: DC | PRN
  Administered 2021-09-08: 120 mg via INTRAVENOUS

## 2021-09-08 MED ORDER — IOHEXOL 300 MG/ML  SOLN
75.0000 mL | Freq: Once | INTRAMUSCULAR | Status: AC | PRN
  Administered 2021-09-08: 100 mL via INTRAVENOUS

## 2021-09-08 MED ORDER — FENTANYL CITRATE (PF) 250 MCG/5ML IJ SOLN
INTRAMUSCULAR | Status: DC | PRN
  Administered 2021-09-08: 100 ug via INTRAVENOUS
  Administered 2021-09-08: 50 ug via INTRAVENOUS
  Administered 2021-09-08: 100 ug via INTRAVENOUS

## 2021-09-08 MED ORDER — FENTANYL CITRATE PF 50 MCG/ML IJ SOSY
50.0000 ug | PREFILLED_SYRINGE | Freq: Once | INTRAMUSCULAR | Status: AC
  Administered 2021-09-08: 50 ug via INTRAVENOUS
  Filled 2021-09-08: qty 1

## 2021-09-08 MED ORDER — NICOTINE 21 MG/24HR TD PT24
21.0000 mg | MEDICATED_PATCH | Freq: Every day | TRANSDERMAL | Status: DC
  Administered 2021-09-08: 21 mg via TRANSDERMAL
  Filled 2021-09-08 (×2): qty 1

## 2021-09-08 MED ORDER — LACTATED RINGERS IV SOLN: INTRAVENOUS | Status: AC

## 2021-09-08 MED ORDER — BACITRACIN ZINC 500 UNIT/GM EX OINT
TOPICAL_OINTMENT | CUTANEOUS | Status: DC | PRN
  Administered 2021-09-08: 1 via TOPICAL

## 2021-09-08 MED ORDER — ONDANSETRON HCL 4 MG/2ML IJ SOLN: 4.0000 mg | Freq: Four times a day (QID) | INTRAMUSCULAR | Status: DC | PRN

## 2021-09-08 MED ORDER — ROCURONIUM BROMIDE 10 MG/ML (PF) SYRINGE
PREFILLED_SYRINGE | INTRAVENOUS | Status: DC | PRN
  Administered 2021-09-08: 20 mg via INTRAVENOUS

## 2021-09-08 MED ORDER — HYDROCODONE-ACETAMINOPHEN 7.5-325 MG/15ML PO SOLN: 10.0000 mL | ORAL | Status: DC | PRN

## 2021-09-08 MED ORDER — ORAL CARE MOUTH RINSE: 15.0000 mL | Freq: Once | OROMUCOSAL | Status: AC

## 2021-09-08 MED ORDER — LACTATED RINGERS IV SOLN: INTRAVENOUS | Status: DC

## 2021-09-08 MED ORDER — SODIUM CHLORIDE 0.9% FLUSH
3.0000 mL | Freq: Two times a day (BID) | INTRAVENOUS | Status: DC
  Administered 2021-09-08 – 2021-09-10 (×4): 3 mL via INTRAVENOUS

## 2021-09-08 MED ORDER — LACTATED RINGERS IV SOLN
INTRAVENOUS | Status: DC
Start: 2021-09-08 — End: 2021-09-08

## 2021-09-08 MED ORDER — KETOROLAC TROMETHAMINE 15 MG/ML IJ SOLN: 15.0000 mg | Freq: Four times a day (QID) | INTRAMUSCULAR | Status: DC

## 2021-09-08 MED ORDER — CHLORHEXIDINE GLUCONATE 0.12 % MT SOLN
OROMUCOSAL | Status: AC
  Administered 2021-09-08: 15 mL via OROMUCOSAL
  Filled 2021-09-08: qty 15

## 2021-09-08 MED ORDER — MORPHINE SULFATE (PF) 2 MG/ML IV SOLN
2.0000 mg | INTRAVENOUS | Status: DC | PRN
  Administered 2021-09-08: 2 mg via INTRAVENOUS
  Filled 2021-09-08: qty 1

## 2021-09-08 MED ORDER — ONDANSETRON HCL 4 MG/2ML IJ SOLN
INTRAMUSCULAR | Status: AC
  Filled 2021-09-08: qty 2

## 2021-09-08 MED ORDER — FENTANYL CITRATE (PF) 100 MCG/2ML IJ SOLN: 25.0000 ug | INTRAMUSCULAR | Status: DC | PRN

## 2021-09-08 MED ORDER — LIDOCAINE 2% (20 MG/ML) 5 ML SYRINGE
INTRAMUSCULAR | Status: AC
  Filled 2021-09-08: qty 5

## 2021-09-08 MED ORDER — PROPOFOL 10 MG/ML IV BOLUS
INTRAVENOUS | Status: DC | PRN
  Administered 2021-09-08: 170 mg via INTRAVENOUS

## 2021-09-08 MED ORDER — DEXAMETHASONE SODIUM PHOSPHATE 10 MG/ML IJ SOLN
INTRAMUSCULAR | Status: AC
  Filled 2021-09-08: qty 1

## 2021-09-08 MED ORDER — ONDANSETRON HCL 4 MG PO TABS: 4.0000 mg | ORAL_TABLET | Freq: Four times a day (QID) | ORAL | Status: DC | PRN

## 2021-09-08 MED ORDER — DEXMEDETOMIDINE (PRECEDEX) IN NS 20 MCG/5ML (4 MCG/ML) IV SYRINGE
PREFILLED_SYRINGE | INTRAVENOUS | Status: DC | PRN
  Administered 2021-09-08: 12 ug via INTRAVENOUS
  Administered 2021-09-08: 8 ug via INTRAVENOUS

## 2021-09-08 MED ORDER — MIDAZOLAM HCL 2 MG/2ML IJ SOLN
INTRAMUSCULAR | Status: DC | PRN
Start: 2021-09-08 — End: 2021-09-08
  Administered 2021-09-08: 2 mg via INTRAVENOUS

## 2021-09-08 MED ORDER — DEXMEDETOMIDINE (PRECEDEX) IN NS 20 MCG/5ML (4 MCG/ML) IV SYRINGE
PREFILLED_SYRINGE | INTRAVENOUS | Status: AC
  Filled 2021-09-08: qty 5

## 2021-09-08 SURGICAL SUPPLY — 27 items
BAG COUNTER SPONGE SURGICOUNT (BAG) ×2 IMPLANT
CANISTER SUCT 3000ML PPV (MISCELLANEOUS) ×1 IMPLANT
CLEANER TIP ELECTROSURG 2X2 (MISCELLANEOUS) ×2 IMPLANT
CNTNR URN SCR LID CUP LEK RST (MISCELLANEOUS) ×1 IMPLANT
CONT SPEC 4OZ STRL OR WHT (MISCELLANEOUS) ×1
COVER SURGICAL LIGHT HANDLE (MISCELLANEOUS) ×2 IMPLANT
DRAIN PENROSE 1/4X12 LTX STRL (WOUND CARE) ×1 IMPLANT
ELECT COATED BLADE 2.86 ST (ELECTRODE) ×2 IMPLANT
ELECT REM PT RETURN 9FT ADLT (ELECTROSURGICAL) ×2
ELECTRODE REM PT RTRN 9FT ADLT (ELECTROSURGICAL) ×1 IMPLANT
GAUZE 4X4 16PLY ~~LOC~~+RFID DBL (SPONGE) ×2 IMPLANT
GAUZE SPONGE 4X4 12PLY STRL (GAUZE/BANDAGES/DRESSINGS) ×1 IMPLANT
GLOVE ECLIPSE 7.5 STRL STRAW (GLOVE) ×2 IMPLANT
GOWN STRL REUS W/ TWL LRG LVL3 (GOWN DISPOSABLE) ×2 IMPLANT
GOWN STRL REUS W/TWL LRG LVL3 (GOWN DISPOSABLE) ×2
KIT BASIN OR (CUSTOM PROCEDURE TRAY) ×2 IMPLANT
KIT TURNOVER KIT B (KITS) ×2 IMPLANT
NS IRRIG 1000ML POUR BTL (IV SOLUTION) ×2 IMPLANT
PAD ARMBOARD 7.5X6 YLW CONV (MISCELLANEOUS) ×4 IMPLANT
PENCIL FOOT CONTROL (ELECTRODE) ×2 IMPLANT
SUT CHROMIC 3 0 SH 27 (SUTURE) ×1 IMPLANT
SWAB COLLECTION DEVICE MRSA (MISCELLANEOUS) ×1 IMPLANT
SWAB CULTURE ESWAB REG 1ML (MISCELLANEOUS) ×1 IMPLANT
SYR CONTROL 10ML LL (SYRINGE) ×2 IMPLANT
TOWEL GREEN STERILE FF (TOWEL DISPOSABLE) ×2 IMPLANT
TRAY ENT MC OR (CUSTOM PROCEDURE TRAY) ×2 IMPLANT
YANKAUER SUCT BULB TIP NO VENT (SUCTIONS) ×1 IMPLANT

## 2021-09-08 NOTE — Consult Note (Signed)
Reason for Consult: Neck abscess ?Referring Physician: ER ? ?Edward Klein is an 46 y.o. male.  ?HPI: 46 year old male developed throat pain and malaise 08/17/21 and presented to an urgent care 08/20/21 where he was diagnosed with strep throat and prescribed penicillin.  With persistence of symptoms, he came to the ER on 5/3 where a CT scan was read as there being no fluid collection.  He was treated with Augmentin and prednisone.  He noticed some improvement in symptoms but worsened again over the past 1-2 days.  He has pain with swallowing but denies airway problems or voice changes.  A repeat CT scan at the ER this morning was read as there being a rim-enhancing fluid collection in the left neck.  He was admitted to the hospital and started on IV Unasyn. ? ?Past Medical History:  ?Diagnosis Date  ? Closed left ankle fracture 2006  ? Never repaired  ? ? ?Past Surgical History:  ?Procedure Laterality Date  ? KNEE ARTHROSCOPY WITH ANTERIOR CRUCIATE LIGAMENT (ACL) REPAIR Right 2010  ? ? ?Family History  ?Problem Relation Age of Onset  ? Hypertension Mother   ? Healthy Father   ? ? ?Social History:  reports that he has been smoking cigarettes. He has a 25.00 pack-year smoking history. He has never used smokeless tobacco. He reports current alcohol use. He reports that he does not use drugs. ? ?Allergies: No Known Allergies ? ?Medications: I have reviewed the patient's current medications. ? ?Results for orders placed or performed during the hospital encounter of 09/07/21 (from the past 48 hour(s))  ?Basic metabolic panel     Status: None  ? Collection Time: 09/08/21 12:07 AM  ?Result Value Ref Range  ? Sodium 139 135 - 145 mmol/L  ? Potassium 3.6 3.5 - 5.1 mmol/L  ? Chloride 102 98 - 111 mmol/L  ? CO2 28 22 - 32 mmol/L  ? Glucose, Bld 89 70 - 99 mg/dL  ?  Comment: Glucose reference range applies only to samples taken after fasting for at least 8 hours.  ? BUN 20 6 - 20 mg/dL  ? Creatinine, Ser 1.04 0.61 - 1.24 mg/dL   ? Calcium 9.4 8.9 - 10.3 mg/dL  ? GFR, Estimated >60 >60 mL/min  ?  Comment: (NOTE) ?Calculated using the CKD-EPI Creatinine Equation (2021) ?  ? Anion gap 9 5 - 15  ?  Comment: Performed at [facility], [address] [place], [place] [address]  ?CBC with Differential     Status: Abnormal  ? Collection Time: 09/08/21 12:07 AM  ?Result Value Ref Range  ? WBC 18.4 (H) 4.0 - 10.5 K/uL  ? RBC 5.82 (H) 4.22 - 5.81 MIL/uL  ? Hemoglobin 15.6 13.0 - 17.0 g/dL  ? HCT 47.8 39.0 - 52.0 %  ? MCV 82.1 80.0 - 100.0 fL  ? MCH 26.8 26.0 - 34.0 pg  ? MCHC 32.6 30.0 - 36.0 g/dL  ? RDW 13.3 11.5 - 15.5 %  ? Platelets 435 (H) 150 - 400 K/uL  ? nRBC 0.0 0.0 - 0.2 %  ? Neutrophils Relative % 56 %  ? Neutro Abs 10.5 (H) 1.7 - 7.7 K/uL  ? Lymphocytes Relative 34 %  ? Lymphs Abs 6.2 (H) 0.7 - 4.0 K/uL  ? Monocytes Relative 8 %  ? Monocytes Absolute 1.4 (H) 0.1 - 1.0 K/uL  ? Eosinophils Relative 1 %  ? Eosinophils Absolute 0.1 0.0 - 0.5 K/uL  ? Basophils Relative 0 %  ?  Basophils Absolute 0.1 0.0 - 0.1 K/uL  ? Immature Granulocytes 1 %  ? Abs Immature Granulocytes 0.12 (H) 0.00 - 0.07 K/uL  ?  Comment: Performed at [facility], [address], [place], [place] [address]  ?Culture, blood (routine x 2)     Status: None (Preliminary result)  ? Collection Time: 09/08/21  2:51 AM  ? Specimen: BLOOD LEFT ARM  ?Result Value Ref Range  ? Specimen Description BLOOD LEFT ARM   ? Special Requests    ?  BOTTLES DRAWN AEROBIC AND ANAEROBIC Blood Culture adequate volume ?Performed at [facility], [address], [place], [place] [address] ?  ? Culture PENDING   ? Report Status PENDING   ?Culture, blood (routine x 2)     Status: None (Preliminary result)  ? Collection Time: 09/08/21  2:56 AM  ? Specimen: BLOOD LEFT FOREARM  ?Result Value Ref Range  ? Specimen Description BLOOD LEFT FOREARM   ? Special Requests    ?  BOTTLES DRAWN AEROBIC AND ANAEROBIC Blood Culture adequate volume ?Performed at [facility], [address], [place], [place] [address] ?  ? Culture PENDING   ? Report Status PENDING   ? ? ?CT Soft Tissue Neck W Contrast ? ?Result Date: 09/08/2021 ?CLINICAL DATA:  Initial evaluation for acute sore throat, left-sided neck pain. EXAM: CT NECK WITH CONTRAST TECHNIQUE: Multidetector CT imaging of the neck was performed using the standard protocol following the bolus administration of intravenous contrast. RADIATION DOSE REDUCTION: This exam was performed according to the departmental dose-optimization program which includes automated exposure control, adjustment of the mA and/or kV according to patient size and/or use of iterative reconstruction technique. CONTRAST:  OMNIPAQUE IOHEXOL 300 MG/ML  SOLN COMPARISON:  Recent CT from 09/03/2021. FINDINGS: Pharynx and larynx: Oral cavity within normal limits. No acute inflammatory changes seen about the dentition. Palatine tonsils within normal limits. Negative nasopharynx. Persistent edema and swelling involving the left pharyngeal mucosa, consistent with acute pharyngitis. Associated swelling with inflammatory stranding seen within the adjacent left parapharyngeal and submandibular spaces. Changes most pronounced at the level of the left piriform sinus. Thickening and edematous changes involving the epiglottis and left aryepiglottic fold. There has been interval development of a rim enhancing fluid collection interposed between the left aspect of the hyoid and thyroid cartilages measuring 3.6 x 1.7 x 1.8 cm (series 5, image 72), consistent with abscess. Regional swelling and edema with partial effacement of the supraglottic airway, measuring 4 mm in transverse diameter at its most narrow point. Changes are progressed from prior. Glottis within normal limits inferiorly. Subglottic airway clear. Salivary glands: Punctate sialolith noted within the right parotid gland. Parotid and submandibular glands otherwise within normal limits. Thyroid: 1.8 cm right  thyroid nodule again noted. Lymph nodes: Left greater than right upper cervical adenopathy measures up to 1.8 cm, presumably reactive. Vascular: Normal intravascular enhancement seen throughout the neck. Limited intracranial: Unremarkable. Visualized orbits: Unremarkable. Mastoids and visualized paranasal sinuses: Mild mucosal thickening noted about the ethmoidal air cells and maxillary sinuses. Mastoid air cells and middle ear cavities are well pneumatized and free of fluid. Skeleton: No discrete or worrisome osseous lesions. Mild-to-moderate spondylosis present at C4-5 through C6-7. Upper chest: Visualized upper chest demonstrates no acute finding. Other: None. IMPRESSION: 1. Findings consistent with persistent acute left pharyngitis/supraglottitis, with interval development of a 3.6 x 1.7 x 1.8 cm abscess interposed between the left aspect of the hyoid and thyroid cartilages. Regional swelling and  edema with partial effacement of the supraglottic airway, measuring 4 mm in transverse diameter at its most narrow point. Changes have progressed from prior. 2. Enlarged left greater than right cervical adenopathy, presumably reactive. 3. 1.8 cm right thyroid nodule. Further evaluation with dedicated thyroid ultrasound recommended. (Ref: J Am Coll Radiol. 2015 Feb;12(2): 143-50). Electronically Signed   By: Rise MuBenjamin  McClintock M.D.   On: 09/08/2021 02:05   ? ?Review of Systems  ?HENT:  Positive for sore throat and trouble swallowing.   ?All other systems reviewed and are negative. ?Blood pressure 125/81, pulse 75, temperature 97.9 ?F (36.6 ?C), temperature source Oral, resp. rate 16, height 5\' 10"  (1.778 m), weight 107.5 kg, SpO2 98 %. ?Physical Exam ?Constitutional:   ?   Appearance: Normal appearance. He is normal weight.  ?   Comments: Normal voice.  No stridor.  ?HENT:  ?   Head: Normocephalic and atraumatic.  ?   Right Ear: External ear normal.  ?   Left Ear: External ear normal.  ?   Nose: Nose normal.  ?    Mouth/Throat:  ?   Mouth: Mucous membranes are moist.  ?   Pharynx: Oropharynx is clear.  ?Eyes:  ?   Extraocular Movements: Extraocular movements intact.  ?   Conjunctiva/sclera: Conjunctivae normal.  ?

## 2021-09-08 NOTE — Progress Notes (Signed)
Plan of Care Note for accepted transfer ? ? ?Patient: Edward Klein MRN: [id number]   DOA: 09/07/2021 ? ?Facility requesting transfer: [facility] ?Requesting Provider: Dr. Karle Starch ?Reason for transfer: Pharyngeal Abscess ?Facility course:  ? ?46 year old male without significant past medical history who presents to [facility] with complaints of throat pain.   Patient explains that he began to experience throat pain approximately 2 weeks ago.  Patient was seen in a local urgent care clinic after several days of symptoms and was prescribed with a course of penicillin for what was thought to be strep throat.   ? ?Several days later, due to the patient having persisting systems he presented to [facility] for evaluation on 5/3.  During that evaluation patient was started on Augmentin and Decadron and discharged home.  Despite taking these medications as instructed patient's symptoms have continued to worsen.  Patient then presented to [facility] with worsening pain and swelling. ? ?Upon evaluation in the emergency department CT imaging of the soft tissues of the neck was performed revealing findings consistent with acute left pharyngitis and supraglottitis with interval development of a 3.6 x 1.7 x 1.8 cm abscess in between the left aspect of the hyoid and thyroid cartilages.  Patient was initiated on intravenous Unasyn and intravenous lactated Ringer solution.  ER provider then discussed case with Dr. Redmond Baseman with ENT who recommended transfer to [facility] and notifying him upon patient arrival for expected operative intervention. ? ?Plan of care: ?The patient is accepted for admission to Progressive unit, at [facility]..  ? ? ?Author: ?Vernelle Emerald, MD ?09/08/2021 ? ?Check [email or web address] for on-call coverage. ? ?Nursing staff, Please call [facility] number on Amion as soon as patient's arrival, so appropriate admitting provider can evaluate the pt. ?

## 2021-09-08 NOTE — Anesthesia Preprocedure Evaluation (Addendum)
Anesthesia Evaluation  ?Patient identified by MRN, date of birth, ID band ?Patient awake ? ?General Assessment Comment:History noted ?Dr. Chilton Si ? ?Reviewed: ?Allergy & Precautions, NPO status  ? ?Airway ?Mallampati: II ? ?TM Distance: >3 FB ? ? ? ? Dental ?  ?Pulmonary ?Current Smoker and Patient abstained from smoking.,  ?  ?breath sounds clear to auscultation ? ? ? ? ? ? Cardiovascular ?negative cardio ROS ? ? ?Rhythm:Regular Rate:Normal ? ? ?  ?Neuro/Psych ?  ? GI/Hepatic ?negative GI ROS, Neg liver ROS,   ?Endo/Other  ?negative endocrine ROS ? Renal/GU ?negative Renal ROS  ? ?  ?Musculoskeletal ? ? Abdominal ?  ?Peds ? Hematology ?  ?Anesthesia Other Findings ? ? Reproductive/Obstetrics ? ?  ? ? ? ? ? ? ? ? ? ? ? ? ? ?  ?  ? ? ? ? ? ? ? ? ?Anesthesia Physical ?Anesthesia Plan ? ?ASA: 1 and emergent ? ?Anesthesia Plan: General  ? ?Post-op Pain Management:   ? ?Induction: Intravenous ? ?PONV Risk Score and Plan: Ondansetron, Dexamethasone, Midazolam and Treatment may vary due to age or medical condition ? ?Airway Management Planned: Oral ETT ? ?Additional Equipment:  ? ?Intra-op Plan:  ? ?Post-operative Plan:  ? ?Informed Consent: I have reviewed the patients History and Physical, chart, labs and discussed the procedure including the risks, benefits and alternatives for the proposed anesthesia with the patient or authorized representative who has indicated his/her understanding and acceptance.  ? ? ? ?Dental advisory given ? ?Plan Discussed with: CRNA ? ?Anesthesia Plan Comments:   ? ? ? ? ? ? ?Anesthesia Quick Evaluation ? ?

## 2021-09-08 NOTE — Transfer of Care (Signed)
Immediate Anesthesia Transfer of Care Note ? ?Patient: Edward Klein ? ?Procedure(s) Performed: Excision of left neck cyst (Left) ? ?Patient Location: PACU ? ?Anesthesia Type:General ? ?Level of Consciousness: awake, alert  and oriented ? ?Airway & Oxygen Therapy: Patient Spontanous Breathing and Patient connected to face mask oxygen ? ?Post-op Assessment: Report given to RN and Post -op Vital signs reviewed and stable ? ?Post vital signs: Reviewed and stable ? ?Last Vitals:  ?Vitals Value Taken Time  ?BP 146/92 09/08/21 1900  ?Temp    ?Pulse 97 09/08/21 1902  ?Resp 17 09/08/21 1902  ?SpO2 93 % 09/08/21 1902  ?Vitals shown include unvalidated device data. ? ?Last Pain:  ?Vitals:  ? 09/08/21 1650  ?TempSrc:   ?PainSc: 0-No pain  ?   ? ?  ? ?Complications: No notable events documented. ?

## 2021-09-08 NOTE — Op Note (Signed)
OPERATIVE REPORT ? ?DATE OF SURGERY: 09/08/2021 ? ?PATIENT:  Edward Klein,  46 y.o. male ? ?PRE-OPERATIVE DIAGNOSIS:  LEFT NECK ABSCESS ? ?POST-OPERATIVE DIAGNOSIS:  LEFT NECK cyst ? ?PROCEDURE:  Procedure(s): ?Removal of left laryngeal cyst ? ?SURGEON:  Susy Frizzle, MD ? ?ASSISTANTS: None ? ?ANESTHESIA:   General  ? ?EBL: 25 ml ? ?DRAINS: Quarter-inch Penrose ? ?LOCAL MEDICATIONS USED:  None ? ?SPECIMEN: Left laryngeal cyst ? ?COUNTS:  Correct ? ?PROCEDURE DETAILS: ?The patient was taken to the operating room and placed on the operating table in the supine position. Following induction of general endotracheal anesthesia, the neck was prepped and draped in a standard fashion.  A transverse incision was outlined with a marking pen and a crease just about at the level of the thyrohyoid membrane.  Electrocautery was used to incise the skin and subcutaneous tissue.  The strap muscles were dissected laterally exposing the thyrohyoid membrane.  There was no obvious inflammation or abscess identified but there was what appeared to be a cystic sac identified between the thyroid and the hyoid.  This was dissected free of surrounding tissue and resected completely and sent for pathologic evaluation.  The pharyngeal mucosa was not exposed.  The wound was irrigated with saline.  Quarter inch Penrose was placed into the depths of the wound and secured in place with a chromic suture.  The wound was reapproximated in layers using interrupted chromic in the deeper layers and running 3-0 chromic on the subcuticular layer.  A dressing was applied with bacitracin.  Patient was awakened extubated and transferred to recovery in stable condition. ? ? ? ?PATIENT DISPOSITION:  To PACU, stable ? ? ? ?

## 2021-09-08 NOTE — ED Notes (Signed)
[facility] arrived to transport pt. Pt stable at time of departure ?

## 2021-09-08 NOTE — Progress Notes (Signed)
Same day note ? ?46 yo with recent dx strep pharyngitis here with progression of his symptoms despite steroids and abx.  CT showed L pharyngitis/supraglottitis with interval development of 3.6x1.7 cm abscess interposed between the L aspect of the hyoid and thyroid cartilages, regional swelling and edema with aprtial effacement of the supraglottic airway measuring 4 mm in transverse diameter at it's most narrow point (progressed from priors, see full report).  He's been admitted for IV abx, steroids, and ENT consultation.  ENT taking to OR later today. ? ?Vitals:  ? 09/08/21 0759 09/08/21 1129  ?BP: 134/90 125/81  ?Pulse: 68 75  ?Resp: 14 16  ?Temp: 97.8 ?F (36.6 ?C) 97.9 ?F (36.6 ?C)  ?SpO2: 94% 98%  ? ?Says he's feeling better ? ?NAD ?L sided submandibular and L face swelling, mildy TTP ?RRR ?Unlabored breathing ?No LEE ? ?Edward Klein&P ?1. Pharyngeal abscess  ?- GAS pharyngitis diagnosed on 08/20/21 and treated with 10-day course of penicillin, and then Augmentin and prednisone, now p/w worsening pain and trismus  ?- CT with new 3.6 x 1.7 x 1.8 cm abscess and partial effacement of supraglottic airway   ?- He was treated with Unasyn and Decadron in ED  ?- Appreciate Dr. Jenne Pane consulting, he is aware of patient's arrival to [facility] -> plan for OR this afternoon, per our discussion ?- Continue NPO, IV antibiotics, close monitoring in progressive unit   ?  ?

## 2021-09-08 NOTE — Procedures (Signed)
Preop diagnosis: Left neck abscess and supraglottic edema ?Postop diagnosis: same ?Procedure: Transnasal fiberoptic laryngoscopy ?Surgeon: Redmond Baseman ?Anesth: Topical with 4% lidocaine ?Compl: None ?Findings: There is edema of the left arytenoid and AE fold that is not obstructive.  There is excellent view of the glottis.  There is some edema of the left vallecula as well. ?Description:  After discussing risks, benefits, and alternatives, the patient was placed in a seated position and the right nasal passage was sprayed with topical anesthetic.  The fiberoptic scope was passed through the right nasal passage to view the pharynx and larynx.  Findings are noted above.  The scope was then removed and he was returned to nursing care in stable condition. ? ?

## 2021-09-08 NOTE — Anesthesia Postprocedure Evaluation (Signed)
Anesthesia Post Note ? ?Patient: Hanson Medeiros ? ?Procedure(s) Performed: Excision of left neck cyst (Left) ? ?  ? ?Patient location during evaluation: PACU ?Anesthesia Type: General ?Level of consciousness: awake and alert ?Pain management: pain level controlled ?Vital Signs Assessment: post-procedure vital signs reviewed and stable ?Respiratory status: spontaneous breathing, nonlabored ventilation, respiratory function stable and patient connected to nasal cannula oxygen ?Cardiovascular status: blood pressure returned to baseline and stable ?Postop Assessment: no apparent nausea or vomiting ?Anesthetic complications: no ? ? ?No notable events documented. ? ?Last Vitals:  ?Vitals:  ? 09/08/21 1900 09/08/21 1915  ?BP: (!) 146/92 140/89  ?Pulse: 98 73  ?Resp: 14 16  ?Temp: 36.4 ?C   ?SpO2: 96% 92%  ?  ?Last Pain:  ?Vitals:  ? 09/08/21 1915  ?TempSrc:   ?PainSc: Asleep  ? ? ?  ?  ?  ?  ?  ?  ? ?Collene Schlichter ? ? ? ? ?

## 2021-09-08 NOTE — Plan of Care (Signed)
  Problem: Clinical Measurements: Goal: Will remain free from infection Outcome: Not Progressing   

## 2021-09-08 NOTE — Progress Notes (Signed)
Patient arrived via [facility] from [facility]. VSS; placed on telemetry, CCMD notified; pt oriented to room. Call bell within reach. MD notified ?

## 2021-09-08 NOTE — H&P (Signed)
?History and Physical  ? ? ?Edward Klein UEA:[id number] DOB: 03-Jun-1975 DOA: 09/07/2021 ? ?PCP: Patient, No Pcp Per (Inactive)  ? ?Patient coming from: Home  ? ?Chief Complaint: Throat pain, difficulty swallowing  ? ?HPI: Edward Klein is a pleasant 46 y.o. male who denies any significant past medical history now presents emergency department with worsening throat pain and difficulty swallowing.  The patient reports that he developed sore throat and general malaise around 08/17/2021, presented to an urgent care on 08/20/2021 where he was diagnosed with group A streptococcal pharyngitis and prescribed 10 days of penicillin.  Symptoms persisted despite completing this course of antibiotics, and he presented to the emergency department on 09/02/2021 where he was prescribed Augmentin and prednisone after a CT neck was obtained and negative for fluid collection.  He had some improvement initially on Augmentin before he began to worsen again over the past 24 to 48 hours.  He has pain and difficulty opening his mouth, and pain and difficulty with swallowing.  He was able to eat some lunch at 1 PM yesterday, and able to drink some coffee yesterday evening.  He denies shortness of breath, noisy breathing, drooling, or spitting. ? ?[facility] ED Course: Upon arrival to the ED, patient is found to be afebrile and saturating well on room air with normal heart rate and stable blood pressure.  CBC notable for leukocytosis and thrombocytosis.  CT of the neck soft tissues demonstrates interval development of pharyngeal abscess with regional swelling and edema causing partial effacement of the supraglottic airway which is 4 mm at its narrowest point.  Patient was treated with Unasyn, Decadron, fentanyl, and IV fluids in the emergency department, ENT was consulted by the ED physician, and patient was transferred to [facility] for admission. ? ?Review of Systems:  ?All other systems reviewed and apart from HPI, are  negative. ? ?Past Medical History:  ?Diagnosis Date  ? Closed left ankle fracture 2006  ? Never repaired  ? ? ?Past Surgical History:  ?Procedure Laterality Date  ? KNEE ARTHROSCOPY WITH ANTERIOR CRUCIATE LIGAMENT (ACL) REPAIR Right 2010  ? ? ?Social History:  ? reports that he has been smoking cigarettes. He has a 25.00 pack-year smoking history. He has never used smokeless tobacco. He reports current alcohol use. He reports that he does not use drugs. ? ?No Known Allergies ? ?Family History  ?Problem Relation Age of Onset  ? Hypertension Mother   ? Healthy Father   ? ? ? ?Prior to Admission medications   ?Medication Sig Start Date End Date Taking? Authorizing Provider  ?amoxicillin-clavulanate (AUGMENTIN) 500-125 MG tablet Take 1 tablet (500 mg total) by mouth every 8 (eight) hours. 09/03/21   Geoffery Lyons, MD  ?HYDROcodone-acetaminophen (NORCO) 5-325 MG tablet Take 1-2 tablets by mouth every 6 (six) hours as needed. 09/03/21   Geoffery Lyons, MD  ?ibuprofen (ADVIL) 800 MG tablet Take 1 tablet (800 mg total) by mouth every 8 (eight) hours as needed. 08/20/21   Leath-Warren, Sadie Haber, NP  ?lidocaine (XYLOCAINE) 2 % solution Use as directed 5 mLs in the mouth or throat every 4 (four) hours as needed for mouth pain. 08/20/21   Leath-Warren, Sadie Haber, NP  ?naproxen (NAPROSYN) 500 MG tablet Take 1 tablet (500 mg total) by mouth 2 (two) times daily. ?Patient not taking: Reported on 08/20/2021 07/17/21   Zenia Resides, MD  ?predniSONE (DELTASONE) 10 MG tablet Take 2 tablets (20 mg total) by mouth 2 (two) times daily with a meal. 09/03/21  Geoffery Lyonselo, Douglas, MD  ?tiZANidine (ZANAFLEX) 4 MG tablet Take 1 tablet (4 mg total) by mouth every 8 (eight) hours as needed for muscle spasms. ?Patient not taking: Reported on 08/20/2021 07/17/21   Zenia ResidesBanister, Pamela K, MD  ? ? ?Physical Exam: ?Vitals:  ? 09/08/21 0200 09/08/21 0315 09/08/21 0400 09/08/21 0453  ?BP: 121/86 (!) 150/98 131/89 (!) 132/93  ?Pulse: 72 71 66 72  ?Resp: 18 18 14 14    ?Temp:   98.9 ?F (37.2 ?C) 97.7 ?F (36.5 ?C)  ?TempSrc:   Oral Oral  ?SpO2: 98% 96% 95% 97%  ?Weight:      ?Height:      ? ? ?Constitutional: NAD, calm  ?Eyes: PERTLA, lids and conjunctivae normal ?ENMT: Mucous membranes are moist. Posterior pharyngeal edema.   ?Neck: Cervical adenopathy. Tender on left.   ?Respiratory: clear to auscultation bilaterally, no wheezing, no stridor. No accessory muscle use.  ?Cardiovascular: S1 & S2 heard, regular rate and rhythm. No extremity edema.  ?Abdomen: No distension, no tenderness, soft. Bowel sounds active.  ?Musculoskeletal: no clubbing / cyanosis. No joint deformity upper and lower extremities.   ?Skin: no significant rashes, lesions, ulcers. Warm, dry, well-perfused. ?Neurologic: CN 2-12 grossly intact. Moving all extremities. Alert and oriented.  ?Psychiatric: Very pleasant. Cooperative.  ? ? ?Labs and Imaging on Admission: I have personally reviewed following labs and imaging studies ? ?CBC: ?Recent Labs  ?Lab 09/03/21 ?0010 09/08/21 ?0007  ?WBC 20.1* 18.4*  ?NEUTROABS 16.6* 10.5*  ?HGB 16.4 15.6  ?HCT 50.1 47.8  ?MCV 81.3 82.1  ?PLT 337 435*  ? ?Basic Metabolic Panel: ?Recent Labs  ?Lab 09/03/21 ?0010 09/08/21 ?0007  ?NA 136 139  ?K 3.8 3.6  ?CL 100 102  ?CO2 26 28  ?GLUCOSE 96 89  ?BUN 15 20  ?CREATININE 0.91 1.04  ?CALCIUM 10.2 9.4  ? ?GFR: ?Estimated Creatinine Clearance: 110.1 mL/min (by C-G formula based on SCr of 1.04 mg/dL). ?Liver Function Tests: ?No results for input(s): AST, ALT, ALKPHOS, BILITOT, PROT, ALBUMIN in the last 168 hours. ?No results for input(s): LIPASE, AMYLASE in the last 168 hours. ?No results for input(s): AMMONIA in the last 168 hours. ?Coagulation Profile: ?No results for input(s): INR, PROTIME in the last 168 hours. ?Cardiac Enzymes: ?No results for input(s): CKTOTAL, CKMB, CKMBINDEX, TROPONINI in the last 168 hours. ?BNP (last 3 results) ?No results for input(s): PROBNP in the last 8760 hours. ?HbA1C: ?No results for input(s): HGBA1C in  the last 72 hours. ?CBG: ?No results for input(s): GLUCAP in the last 168 hours. ?Lipid Profile: ?No results for input(s): CHOL, HDL, LDLCALC, TRIG, CHOLHDL, LDLDIRECT in the last 72 hours. ?Thyroid Function Tests: ?No results for input(s): TSH, T4TOTAL, FREET4, T3FREE, THYROIDAB in the last 72 hours. ?Anemia Panel: ?No results for input(s): VITAMINB12, FOLATE, FERRITIN, TIBC, IRON, RETICCTPCT in the last 72 hours. ?Urine analysis: ?No results found for: COLORURINE, APPEARANCEUR, LABSPEC, PHURINE, GLUCOSEU, HGBUR, BILIRUBINUR, KETONESUR, PROTEINUR, UROBILINOGEN, NITRITE, LEUKOCYTESUR ?Sepsis Labs: ?@[email or web address] (procalcitonin:4,lacticidven:4) ?) ?Recent Results (from the past 240 hour(s))  ?Group A Strep by PCR     Status: Abnormal  ? Collection Time: 09/02/21 11:42 PM  ? Specimen: Throat; Sterile Swab  ?Result Value Ref Range Status  ? Group A Strep by PCR DETECTED (A) NOT DETECTED Final  ?  Comment: Performed at [facility], [address], [place], [place] [address]  ?  ? ?Radiological Exams on Admission: ?CT Soft Tissue Neck W Contrast ? ?Result Date: 09/08/2021 ?CLINICAL DATA:  Initial evaluation for acute  sore throat, left-sided neck pain. EXAM: CT NECK WITH CONTRAST TECHNIQUE: Multidetector CT imaging of the neck was performed using the standard protocol following the bolus administration of intravenous contrast. RADIATION DOSE REDUCTION: This exam was performed according to the departmental dose-optimization program which includes automated exposure control, adjustment of the mA and/or kV according to patient size and/or use of iterative reconstruction technique. CONTRAST:  OMNIPAQUE IOHEXOL 300 MG/ML  SOLN COMPARISON:  Recent CT from 09/03/2021. FINDINGS: Pharynx and larynx: Oral cavity within normal limits. No acute inflammatory changes seen about the dentition. Palatine tonsils within normal limits. Negative nasopharynx. Persistent edema and swelling involving the left pharyngeal  mucosa, consistent with acute pharyngitis. Associated swelling with inflammatory stranding seen within the adjacent left parapharyngeal and submandibular spaces. Changes most pronounced at the level of the left pirifor

## 2021-09-08 NOTE — ED Notes (Signed)
1st attempt made to call report. ?

## 2021-09-08 NOTE — Anesthesia Procedure Notes (Signed)
Procedure Name: Intubation ?Date/Time: 09/08/2021 6:06 PM ?Performed by: Mayer Camel, CRNA ?Pre-anesthesia Checklist: Patient identified, Emergency Drugs available, Suction available and Patient being monitored ?Patient Re-evaluated:Patient Re-evaluated prior to induction ?Oxygen Delivery Method: Circle System Utilized ?Preoxygenation: Pre-oxygenation with 100% oxygen ?Induction Type: IV induction and Rapid sequence ?Laryngoscope Size: Hyacinth Meeker and 2 ?Grade View: Grade I ?Tube type: Oral ?Number of attempts: 1 ?Airway Equipment and Method: Stylet ?Placement Confirmation: ETT inserted through vocal cords under direct vision, positive ETCO2 and breath sounds checked- equal and bilateral ?Secured at: 21 cm ?Tube secured with: Tape ?Dental Injury: Teeth and Oropharynx as per pre-operative assessment  ? ? ? ? ?

## 2021-09-09 ENCOUNTER — Encounter (HOSPITAL_COMMUNITY): Payer: Self-pay | Admitting: Otolaryngology

## 2021-09-09 LAB — PHOSPHORUS: Phosphorus: 3.5 mg/dL (ref 2.5–4.6)

## 2021-09-09 LAB — CBC WITH DIFFERENTIAL/PLATELET
Abs Immature Granulocytes: 0.14 10*3/uL — ABNORMAL HIGH (ref 0.00–0.07)
Basophils Absolute: 0 10*3/uL (ref 0.0–0.1)
Basophils Relative: 0 %
Eosinophils Absolute: 0 10*3/uL (ref 0.0–0.5)
Eosinophils Relative: 0 %
HCT: 45.3 % (ref 39.0–52.0)
Hemoglobin: 15.1 g/dL (ref 13.0–17.0)
Immature Granulocytes: 1 %
Lymphocytes Relative: 15 %
Lymphs Abs: 3.4 10*3/uL (ref 0.7–4.0)
MCH: 27.4 pg (ref 26.0–34.0)
MCHC: 33.3 g/dL (ref 30.0–36.0)
MCV: 82.2 fL (ref 80.0–100.0)
Monocytes Absolute: 1.2 10*3/uL — ABNORMAL HIGH (ref 0.1–1.0)
Monocytes Relative: 5 %
Neutro Abs: 18.6 10*3/uL — ABNORMAL HIGH (ref 1.7–7.7)
Neutrophils Relative %: 79 %
Platelets: 439 10*3/uL — ABNORMAL HIGH (ref 150–400)
RBC: 5.51 MIL/uL (ref 4.22–5.81)
RDW: 13.3 % (ref 11.5–15.5)
WBC: 23.3 10*3/uL — ABNORMAL HIGH (ref 4.0–10.5)
nRBC: 0 % (ref 0.0–0.2)

## 2021-09-09 LAB — COMPREHENSIVE METABOLIC PANEL
ALT: 26 U/L (ref 0–44)
AST: 15 U/L (ref 15–41)
Albumin: 2.8 g/dL — ABNORMAL LOW (ref 3.5–5.0)
Alkaline Phosphatase: 75 U/L (ref 38–126)
Anion gap: 3 — ABNORMAL LOW (ref 5–15)
BUN: 18 mg/dL (ref 6–20)
CO2: 28 mmol/L (ref 22–32)
Calcium: 8.8 mg/dL — ABNORMAL LOW (ref 8.9–10.3)
Chloride: 103 mmol/L (ref 98–111)
Creatinine, Ser: 0.74 mg/dL (ref 0.61–1.24)
GFR, Estimated: >60 mL/min (ref >60)
Glucose, Bld: 140 mg/dL — ABNORMAL HIGH (ref 70–99)
Potassium: 4 mmol/L (ref 3.5–5.1)
Sodium: 134 mmol/L — ABNORMAL LOW (ref 135–145)
Total Bilirubin: 0.4 mg/dL (ref 0.3–1.2)
Total Protein: 6.9 g/dL (ref 6.5–8.1)

## 2021-09-09 LAB — MAGNESIUM: Magnesium: 1.9 mg/dL (ref 1.7–2.4)

## 2021-09-09 LAB — HIV ANTIBODY (ROUTINE TESTING W REFLEX): HIV Screen 4th Generation wRfx: NONREACTIVE

## 2021-09-09 NOTE — Progress Notes (Signed)
?PROGRESS NOTE ? ?Edward Klein WUJ:[id number] DOB: Nov 26, 1975 DOA: 09/07/2021 ?PCP: Patient, No Pcp Per (Inactive) ? ? LOS: 1 day  ? ?Brief Narrative / Interim history: ?46 year old male with no significant past medical history comes into the hospital with worsening throat pain and difficulty swallowing.  He had a sore throat and general malaise on 08/17/21, went to urgent care, was diagnosed with group A strep pharyngitis and was given 10 days of penicillin.  Completed antibiotics but symptoms persisted and came back to the hospital.  CT of the neck on admission showed a fluid collection.  ENT consulted, he was taken to the OR on 5/9 and was admitted to the hospital ? ?Subjective / 24h Interval events: ?Doing well.  Less pain in his throat.  He was able to eat some breakfast today. ? ?Assesement and Plan: ?Principal Problem: ?  Abscess, pharyngeal ? ?Principal problem ?Pharyngeal abscess versus cyst-was taken to the OR 5/9 by Dr. Pollyann Kennedy, abscess was not identified but a cystic type mass was removed.  Has been placed on IV antibiotics.  His leukocytosis is a little bit worse today.  Continue IV antibiotics.  Awaiting cultures. ? 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  He would benefit from weight loss ? ?Scheduled Meds: ? mupirocin ointment  1 application. Nasal BID  ? nicotine  21 mg Transdermal Daily  ? sodium chloride flush  3 mL Intravenous Q12H  ? ?Continuous Infusions: ? ampicillin-sulbactam (UNASYN) IV 3 g (09/09/21 0830)  ? ?PRN Meds:.acetaminophen **OR** acetaminophen, HYDROcodone-acetaminophen, morphine injection, ondansetron **OR** ondansetron (ZOFRAN) IV ? ?Diet Orders (From admission, onward)  ? ?  Start     Ordered  ? 09/08/21 1909  Diet regular Room service appropriate? Yes; Fluid consistency: Thin  Diet effective now       ?Question Answer Comment  ?Room service appropriate? Yes   ?Fluid consistency: Thin   ?  ? 09/08/21 1908  ? ?  ?  ? ?  ? ? ?DVT prophylaxis: SCDs Start: 09/08/21 0521 ? ? ?Lab  Results  ?Component Value Date  ? PLT 439 (H) 09/09/2021  ? ? ?  Code Status: Full Code ? ?Family Communication: no family at bedside  ? ?Status is: Inpatient ? ?Remains inpatient appropriate because: leukocytosis  ? ?Level of care: Progressive ? ?Consultants:  ?ENT ? ?Procedures:  ?Neck cyst removal 5/9 ? ?Microbiology  ?Blood cultures 5/9-no growth to date, pending ? ?Antimicrobials: ?Unasyn ? ? ?Objective: ?Vitals:  ? 09/08/21 2019 09/08/21 2309 09/09/21 0532 09/09/21 0751  ?BP: (!) 138/99 129/89 122/85 129/81  ?Pulse: 78 65 71 71  ?Resp: 13 18  14   ?Temp: 97.7 ?F (36.5 ?C) 97.7 ?F (36.5 ?C) 97.6 ?F (36.4 ?C) 97.6 ?F (36.4 ?C)  ?TempSrc: Oral Oral Oral Oral  ?SpO2: 95% 96% 94% 95%  ?Weight:      ?Height:      ? ? ?Intake/Output Summary (Last 24 hours) at 09/09/2021 1018 ?Last data filed at 09/08/2021 2350 ?Gross per 24 hour  ?Intake 2257.34 ml  ?Output 20 ml  ?Net 2237.34 ml  ? ?Wt Readings from Last 3 Encounters:  ?09/07/21 107.5 kg  ?09/02/21 113.4 kg  ?02/02/20 113.4 kg  ? ? ?Examination: ? ?Constitutional: NAD ?Eyes: no scleral icterus ?ENMT: Mucous membranes are moist.  ?Neck: normal, supple ?Respiratory: clear to auscultation bilaterally, no wheezing, no crackles.  ?Cardiovascular: Regular rate and rhythm, no murmurs / rubs / gallops. No LE edema.  ?Abdomen: non distended, no tenderness. Bowel sounds positive.  ?Musculoskeletal: no  clubbing / cyanosis.  ?Skin: no rashes ?Neurologic: non focal  ? ?Data Reviewed: I have independently reviewed following labs and imaging studies ? ?CBC ?Recent Labs  ?Lab 09/03/21 ?0010 09/08/21 ?0007 09/09/21 ?8938  ?WBC 20.1* 18.4* 23.3*  ?HGB 16.4 15.6 15.1  ?HCT 50.1 47.8 45.3  ?PLT 337 435* 439*  ?MCV 81.3 82.1 82.2  ?MCH 26.6 26.8 27.4  ?MCHC 32.7 32.6 33.3  ?RDW 13.2 13.3 13.3  ?LYMPHSABS 2.5 6.2* 3.4  ?MONOABS 0.9 1.4* 1.2*  ?EOSABS 0.0 0.1 0.0  ?BASOSABS 0.1 0.1 0.0  ? ? ?Recent Labs  ?Lab 09/03/21 ?0010 09/08/21 ?0007 09/09/21 ?1017  ?NA 136 139 134*  ?K 3.8 3.6 4.0  ?CL  100 102 103  ?CO2 26 28 28   ?GLUCOSE 96 89 140*  ?BUN 15 20 18   ?CREATININE 0.91 1.04 0.74  ?CALCIUM 10.2 9.4 8.8*  ?AST  --   --  15  ?ALT  --   --  26  ?ALKPHOS  --   --  75  ?BILITOT  --   --  0.4  ?ALBUMIN  --   --  2.8*  ?MG  --   --  1.9  ? ? ?------------------------------------------------------------------------------------------------------------------ ?No results for input(s): CHOL, HDL, LDLCALC, TRIG, CHOLHDL, LDLDIRECT in the last 72 hours. ? ?No results found for: HGBA1C ?------------------------------------------------------------------------------------------------------------------ ?No results for input(s): TSH, T4TOTAL, T3FREE, THYROIDAB in the last 72 hours. ? ?Invalid input(s): FREET3 ? ?Cardiac Enzymes ?No results for input(s): CKMB, TROPONINI, MYOGLOBIN in the last 168 hours. ? ?Invalid input(s): CK ?------------------------------------------------------------------------------------------------------------------ ?No results found for: BNP ? ?CBG: ?No results for input(s): GLUCAP in the last 168 hours. ? ?Recent Results (from the past 240 hour(s))  ?Group A Strep by PCR     Status: Abnormal  ? Collection Time: 09/02/21 11:42 PM  ? Specimen: Throat; Sterile Swab  ?Result Value Ref Range Status  ? Group A Strep by PCR DETECTED (A) NOT DETECTED Final  ?  Comment: Performed at , [address], [place], [address] [place]  ?Culture, blood (routine x 2)     Status: None (Preliminary result)  ? Collection Time: 09/08/21  2:51 AM  ? Specimen: BLOOD LEFT ARM  ?Result Value Ref Range Status  ? Specimen Description BLOOD LEFT ARM  Final  ? Special Requests   Final  ?  BOTTLES DRAWN AEROBIC AND ANAEROBIC Blood Culture adequate volume  ? Culture   Final  ?  NO GROWTH < 24 HOURS ?Performed at [facility], [address], [place], [address] [place] ?  ? Report Status PENDING  Incomplete  ?Culture, blood (routine x 2)     Status: None (Preliminary result)  ? Collection  Time: 09/08/21  2:56 AM  ? Specimen: BLOOD LEFT FOREARM  ?Result Value Ref Range Status  ? Specimen Description BLOOD LEFT FOREARM  Final  ? Special Requests   Final  ?  BOTTLES DRAWN AEROBIC AND ANAEROBIC Blood Culture adequate volume  ? Culture   Final  ?  NO GROWTH < 24 HOURS ?Performed at [facility], [address] [place], [address] [place] ?  ? Report Status PENDING  Incomplete  ?Surgical PCR screen     Status: None  ? Collection Time: 09/08/21  4:22 PM  ? Specimen: Nasal Mucosa; Nasal Swab  ?Result Value Ref Range Status  ? MRSA, PCR NEGATIVE NEGATIVE Final  ? Staphylococcus aureus NEGATIVE NEGATIVE Final  ?  Comment: (NOTE) ?The Xpert SA Assay (FDA  approved for NASAL specimens in patients 4422 ?years of age and older), is one component of a comprehensive ?surveillance program. It is not intended to diagnose infection nor to ?guide or monitor treatment. ?Performed at [facility], [address] [place], [place] [address] ?  ?  ? ?Radiology Studies: ?No results found. ? ? ?Pamella Pertostin Edward Salonga, MD, PhD ?[facility] ? ?Between 7 am - 7 pm I am available, please contact me via Amion (for emergencies) or Securechat (non urgent messages) ? ?Between 7 pm - 7 am I am not available, please contact night coverage MD/APP via Amion ? ?

## 2021-09-09 NOTE — Plan of Care (Signed)
  Problem: Health Behavior/Discharge Planning: Goal: Ability to manage health-related needs will improve Outcome: Progressing   Problem: Clinical Measurements: Goal: Will remain free from infection Outcome: Progressing Goal: Diagnostic test results will improve Outcome: Progressing   

## 2021-09-09 NOTE — Progress Notes (Signed)
Subjective: ?Postop day 1, seems to be comfortable.  Feels that he is a little bit better.  He has been able to eat okay. ? ?Objective: ?Vital signs in last 24 hours: ?Temp:  [97.6 ?F (36.4 ?C)-97.9 ?F (36.6 ?C)] 97.6 ?F (36.4 ?C) (05/10 0751) ?Pulse Rate:  [65-98] 71 (05/10 0751) ?Resp:  [13-18] 14 (05/10 0751) ?BP: (122-146)/(81-99) 129/81 (05/10 0751) ?SpO2:  [92 %-98 %] 95 % (05/10 0751) ?Weight change:  ?  ? ?Intake/Output from previous day: ?05/09 0701 - 05/10 0700 ?In: 2257.3 [I.V.:1957.2; IV Piggyback:300.2] ?Out: 20 [Blood:20] ?Intake/Output this shift: ?No intake/output data recorded. ? ?PHYSICAL EXAM: ?Awake and alert.  Breathing and voice are clear.  Neck is soft.  Dressing in place.  Drain in place. ? ?Lab Results: ?Recent Labs  ?  09/08/21 ?0007 09/09/21 ?0453  ?WBC 18.4* 23.3*  ?HGB 15.6 15.1  ?HCT 47.8 45.3  ?PLT 435* 439*  ? ?BMET ?Recent Labs  ?  09/08/21 ?0007 09/09/21 ?0453  ?NA 139 134*  ?K 3.6 4.0  ?CL 102 103  ?CO2 28 28  ?GLUCOSE 89 140*  ?BUN 20 18  ?CREATININE 1.04 0.74  ?CALCIUM 9.4 8.8*  ? ? ?Studies/Results: ?CT Soft Tissue Neck W Contrast ? ?Result Date: 09/08/2021 ?CLINICAL DATA:  Initial evaluation for acute sore throat, left-sided neck pain. EXAM: CT NECK WITH CONTRAST TECHNIQUE: Multidetector CT imaging of the neck was performed using the standard protocol following the bolus administration of intravenous contrast. RADIATION DOSE REDUCTION: This exam was performed according to the departmental dose-optimization program which includes automated exposure control, adjustment of the mA and/or kV according to patient size and/or use of iterative reconstruction technique. CONTRAST:  OMNIPAQUE IOHEXOL 300 MG/ML  SOLN COMPARISON:  Recent CT from 09/03/2021. FINDINGS: Pharynx and larynx: Oral cavity within normal limits. No acute inflammatory changes seen about the dentition. Palatine tonsils within normal limits. Negative nasopharynx. Persistent edema and swelling involving the left  pharyngeal mucosa, consistent with acute pharyngitis. Associated swelling with inflammatory stranding seen within the adjacent left parapharyngeal and submandibular spaces. Changes most pronounced at the level of the left piriform sinus. Thickening and edematous changes involving the epiglottis and left aryepiglottic fold. There has been interval development of a rim enhancing fluid collection interposed between the left aspect of the hyoid and thyroid cartilages measuring 3.6 x 1.7 x 1.8 cm (series 5, image 72), consistent with abscess. Regional swelling and edema with partial effacement of the supraglottic airway, measuring 4 mm in transverse diameter at its most narrow point. Changes are progressed from prior. Glottis within normal limits inferiorly. Subglottic airway clear. Salivary glands: Punctate sialolith noted within the right parotid gland. Parotid and submandibular glands otherwise within normal limits. Thyroid: 1.8 cm right thyroid nodule again noted. Lymph nodes: Left greater than right upper cervical adenopathy measures up to 1.8 cm, presumably reactive. Vascular: Normal intravascular enhancement seen throughout the neck. Limited intracranial: Unremarkable. Visualized orbits: Unremarkable. Mastoids and visualized paranasal sinuses: Mild mucosal thickening noted about the ethmoidal air cells and maxillary sinuses. Mastoid air cells and middle ear cavities are well pneumatized and free of fluid. Skeleton: No discrete or worrisome osseous lesions. Mild-to-moderate spondylosis present at C4-5 through C6-7. Upper chest: Visualized upper chest demonstrates no acute finding. Other: None. IMPRESSION: 1. Findings consistent with persistent acute left pharyngitis/supraglottitis, with interval development of a 3.6 x 1.7 x 1.8 cm abscess interposed between the left aspect of the hyoid and thyroid cartilages. Regional swelling and edema with partial effacement of the supraglottic  airway, measuring 4 mm in  transverse diameter at its most narrow point. Changes have progressed from prior. 2. Enlarged left greater than right cervical adenopathy, presumably reactive. 3. 1.8 cm right thyroid nodule. Further evaluation with dedicated thyroid ultrasound recommended. (Ref: J Am Coll Radiol. 2015 Feb;12(2): 143-50). Electronically Signed   By: [name] M.D.   On: 09/08/2021 02:05   ? ?Medications: I have reviewed the patient's current medications. ? ?Assessment/Plan: ?Postop day 1.  Abscess was not identified but a cystic type mass was removed.  White blood cell count has gone up a little bit.  Continue to monitor this.  Continue IV antibiotics. ? LOS: 1 day  ? ? ? ? ?Edward Klein ?09/09/2021, 9:56 AM ? ? ?

## 2021-09-10 DIAGNOSIS — E669 Obesity, unspecified: Secondary | ICD-10-CM

## 2021-09-10 DIAGNOSIS — E66811 Obesity, class 1: Secondary | ICD-10-CM

## 2021-09-10 LAB — CBC
HCT: 43.9 % (ref 39.0–52.0)
Hemoglobin: 14.5 g/dL (ref 13.0–17.0)
MCH: 27.2 pg (ref 26.0–34.0)
MCHC: 33 g/dL (ref 30.0–36.0)
MCV: 82.4 fL (ref 80.0–100.0)
Platelets: 362 10*3/uL (ref 150–400)
RBC: 5.33 MIL/uL (ref 4.22–5.81)
RDW: 13.2 % (ref 11.5–15.5)
WBC: 15.4 10*3/uL — ABNORMAL HIGH (ref 4.0–10.5)
nRBC: 0 % (ref 0.0–0.2)

## 2021-09-10 LAB — BASIC METABOLIC PANEL
Anion gap: 7 (ref 5–15)
BUN: 17 mg/dL (ref 6–20)
CO2: 25 mmol/L (ref 22–32)
Calcium: 8.5 mg/dL — ABNORMAL LOW (ref 8.9–10.3)
Chloride: 106 mmol/L (ref 98–111)
Creatinine, Ser: 0.68 mg/dL (ref 0.61–1.24)
GFR, Estimated: >60 mL/min (ref >60)
Glucose, Bld: 89 mg/dL (ref 70–99)
Potassium: 3.8 mmol/L (ref 3.5–5.1)
Sodium: 138 mmol/L (ref 135–145)

## 2021-09-10 LAB — SURGICAL PATHOLOGY

## 2021-09-10 MED ORDER — NICOTINE 21 MG/24HR TD PT24: 21.0000 mg | MEDICATED_PATCH | Freq: Every day | TRANSDERMAL | 0 refills | Status: DC

## 2021-09-10 MED ORDER — AMOXICILLIN-POT CLAVULANATE 875-125 MG PO TABS: 1.0000 | ORAL_TABLET | Freq: Two times a day (BID) | ORAL | 0 refills | Status: AC

## 2021-09-10 MED ORDER — HYDROCODONE-ACETAMINOPHEN 7.5-325 MG/15ML PO SOLN: 10.0000 mL | Freq: Four times a day (QID) | ORAL | 0 refills | Status: DC | PRN

## 2021-09-10 NOTE — Discharge Summary (Signed)
? ?Physician Discharge Summary  ?Edward Klein [id number] DOB: 1975-05-19 DOA: 09/07/2021 ? ?PCP: Patient, No Pcp Per (Inactive) ? ?Admit date: 09/07/2021 ?Discharge date: 09/10/2021 ? ?Admitted From: home ?Disposition:  home ? ?Recommendations for Outpatient Follow-up:  ?Follow up with Dr Constance Holster ENT in 1 week ? ?Home Health: none ?Equipment/Devices: none ? ?Discharge Condition: stable ?CODE STATUS: Full code ?Diet Orders (From admission, onward)  ? ?  Start     Ordered  ? 09/08/21 1909  Diet regular Room service appropriate? Yes; Fluid consistency: Thin  Diet effective now       ?Question Answer Comment  ?Room service appropriate? Yes   ?Fluid consistency: Thin   ?  ? 09/08/21 1908  ? ?  ?  ? ?  ? ? ?HPI: Per admitting MD, ?Edward Klein is a pleasant 46 y.o. male who denies any significant past medical history now presents emergency department with worsening throat pain and difficulty swallowing.  The patient reports that he developed sore throat and general malaise around 08/17/2021, presented to an urgent care on 08/20/2021 where he was diagnosed with group A streptococcal pharyngitis and prescribed 10 days of penicillin.  Symptoms persisted despite completing this course of antibiotics, and he presented to the emergency department on 09/02/2021 where he was prescribed Augmentin and prednisone after a CT neck was obtained and negative for fluid collection.  He had some improvement initially on Augmentin before he began to worsen again over the past 24 to 48 hours.  He has pain and difficulty opening his mouth, and pain and difficulty with swallowing.  He was able to eat some lunch at 1 PM yesterday, and able to drink some coffee yesterday evening.  He denies shortness of breath, noisy breathing, drooling, or spitting. ? ?Hospital Course / Discharge diagnoses: ?Principal Problem: ?  Abscess, pharyngeal ?Active Problems: ?  Class 1 obesity ? ? ?Principal problem ?Pharyngeal abscess versus cyst-was taken to the OR 5/9  by Dr. Constance Holster, abscess was not identified but a cystic type mass was removed.  He was observed on IV antibiotics.  Postoperatively still has significant leukocytosis.  He gradually improved, leukocytosis improving, he is doing well, pain is minimal and able to tolerate a regular diet.  His cultures are without growth at the time of discharge, please follow-up as an outpatient.  With improvement, he will be discharged home in stable condition, will place on Augmentin for 7 additional days and he will follow-up with Dr. Constance Holster within the next week. ?  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  He would benefit from weight loss ? ?Sepsis ruled out ? ? ?Discharge Instructions ? ? ?Allergies as of 09/10/2021   ?No Known Allergies ?  ? ?  ?Medication List  ?  ? ?STOP taking these medications   ? ?amoxicillin-clavulanate 500-125 MG tablet ?Commonly known as: Augmentin ?Replaced by: amoxicillin-clavulanate 875-125 MG tablet ?  ?HYDROcodone-acetaminophen 5-325 MG tablet ?Commonly known as: Norco ?Replaced by: HYDROcodone-acetaminophen 7.5-325 mg/15 ml solution ?  ?ibuprofen 800 MG tablet ?Commonly known as: ADVIL ?  ?lidocaine 2 % solution ?Commonly known as: XYLOCAINE ?  ?naproxen 500 MG tablet ?Commonly known as: NAPROSYN ?  ?predniSONE 10 MG tablet ?Commonly known as: DELTASONE ?  ?tiZANidine 4 MG tablet ?Commonly known as: Zanaflex ?  ? ?  ? ?TAKE these medications   ? ?amoxicillin-clavulanate 875-125 MG tablet ?Commonly known as: Augmentin ?Take 1 tablet by mouth every 12 (twelve) hours for 7 days. ?Replaces: amoxicillin-clavulanate 500-125 MG tablet ?  ?HYDROcodone-acetaminophen  7.5-325 mg/15 ml solution ?Commonly known as: HYCET ?Take 10 mLs by mouth every 6 (six) hours as needed for moderate pain. ?Replaces: HYDROcodone-acetaminophen 5-325 MG tablet ?  ?nicotine 21 mg/24hr patch ?Commonly known as: NICODERM CQ - dosed in mg/24 hours ?Place 1 patch (21 mg total) onto the skin daily. ?Start taking on: Sep 11, 2021 ?  ? ?   ? ? Follow-up Information   ? ? Izora Gala, MD. Schedule an appointment as soon as possible for a visit in 1 week(s).   ?Specialty: Otolaryngology ?Contact information: ?[address] ?[address] ?[place] [address] ?[phone] ? ? ?  ?  ? ?  ?  ? ?  ? ? ?Consultations: ?ENT ? ?Procedures/Studies: ? ?CT Soft Tissue Neck W Contrast ? ?Result Date: 09/08/2021 ?CLINICAL DATA:  Initial evaluation for acute sore throat, left-sided neck pain. EXAM: CT NECK WITH CONTRAST TECHNIQUE: Multidetector CT imaging of the neck was performed using the standard protocol following the bolus administration of intravenous contrast. RADIATION DOSE REDUCTION: This exam was performed according to the departmental dose-optimization program which includes automated exposure control, adjustment of the mA and/or kV according to patient size and/or use of iterative reconstruction technique. CONTRAST:  143mL OMNIPAQUE IOHEXOL 300 MG/ML  SOLN COMPARISON:  Recent CT from 09/03/2021. FINDINGS: Pharynx and larynx: Oral cavity within normal limits. No acute inflammatory changes seen about the dentition. Palatine tonsils within normal limits. Negative nasopharynx. Persistent edema and swelling involving the left pharyngeal mucosa, consistent with acute pharyngitis. Associated swelling with inflammatory stranding seen within the adjacent left parapharyngeal and submandibular spaces. Changes most pronounced at the level of the left piriform sinus. Thickening and edematous changes involving the epiglottis and left aryepiglottic fold. There has been interval development of a rim enhancing fluid collection interposed between the left aspect of the hyoid and thyroid cartilages measuring 3.6 x 1.7 x 1.8 cm (series 5, image 72), consistent with abscess. Regional swelling and edema with partial effacement of the supraglottic airway, measuring 4 mm in transverse diameter at its most narrow point. Changes are progressed from prior. Glottis within  normal limits inferiorly. Subglottic airway clear. Salivary glands: Punctate sialolith noted within the right parotid gland. Parotid and submandibular glands otherwise within normal limits. Thyroid: 1.8 cm right thyroid nodule again noted. Lymph nodes: Left greater than right upper cervical adenopathy measures up to 1.8 cm, presumably reactive. Vascular: Normal intravascular enhancement seen throughout the neck. Limited intracranial: Unremarkable. Visualized orbits: Unremarkable. Mastoids and visualized paranasal sinuses: Mild mucosal thickening noted about the ethmoidal air cells and maxillary sinuses. Mastoid air cells and middle ear cavities are well pneumatized and free of fluid. Skeleton: No discrete or worrisome osseous lesions. Mild-to-moderate spondylosis present at C4-5 through C6-7. Upper chest: Visualized upper chest demonstrates no acute finding. Other: None. IMPRESSION: 1. Findings consistent with persistent acute left pharyngitis/supraglottitis, with interval development of a 3.6 x 1.7 x 1.8 cm abscess interposed between the left aspect of the hyoid and thyroid cartilages. Regional swelling and edema with partial effacement of the supraglottic airway, measuring 4 mm in transverse diameter at its most narrow point. Changes have progressed from prior. 2. Enlarged left greater than right cervical adenopathy, presumably reactive. 3. 1.8 cm right thyroid nodule. Further evaluation with dedicated thyroid ultrasound recommended. (Ref: J Am Coll Radiol. 2015 Feb;12(2): 143-50). Electronically Signed   By: Jeannine Boga M.D.   On: 09/08/2021 02:05  ? ?CT Soft Tissue Neck W Contrast ? ?Result Date: 09/03/2021 ?CLINICAL DATA:  Sore throat  for 2 weeks EXAM: CT NECK WITH CONTRAST TECHNIQUE: Multidetector CT imaging of the neck was performed using the standard protocol following the bolus administration of intravenous contrast. RADIATION DOSE REDUCTION: This exam was performed according to the departmental  dose-optimization program which includes automated exposure control, adjustment of the mA and/or kV according to patient size and/or use of iterative reconstruction technique. CONTRAST:  61mL OMNIPAQUE IOHEXO

## 2021-09-10 NOTE — Discharge Instructions (Signed)
Keep the surgical site clean and dry.  Apply antibiotic ointment twice daily. ?

## 2021-09-10 NOTE — Progress Notes (Signed)
Pt discharged from unit. Medication/discharge instruction given  Kahlani Graber K Reigna Ruperto, RN  

## 2021-09-10 NOTE — Progress Notes (Signed)
Doing better today.  Much less pain.  Surgical site is healing nicely.  No signs of infection.  Drain removed and a fresh dressing applied.  White blood cell count has reduced significantly since yesterday.  He can probably be managed as an outpatient at this point.  Oral antibiotics and follow-up with me next week. ?

## 2021-09-13 LAB — CULTURE, BLOOD (ROUTINE X 2)
Culture: NO GROWTH
Culture: NO GROWTH
Culture: NO GROWTH
Culture: NO GROWTH
Special Requests: ADEQUATE
Special Requests: ADEQUATE
Special Requests: ADEQUATE

## 2023-06-01 ENCOUNTER — Encounter: Payer: Self-pay | Admitting: Podiatry

## 2023-06-13 ENCOUNTER — Encounter: Payer: Self-pay | Admitting: Internal Medicine

## 2023-06-13 ENCOUNTER — Ambulatory Visit: Payer: Medicaid Other | Attending: Internal Medicine | Admitting: Internal Medicine

## 2023-06-13 ENCOUNTER — Ambulatory Visit
Admission: RE | Admit: 2023-06-13 | Discharge: 2023-06-13 | Disposition: A | Discharge status: Home / Self Care | Payer: Medicaid Other | Source: Ambulatory Visit | Attending: Internal Medicine | Admitting: Internal Medicine

## 2023-06-13 VITALS — BP 130/85 | HR 76 | Temp 98.1°F | Ht 70.0 in | Wt 268.0 lb

## 2023-06-13 DIAGNOSIS — M533 Sacrococcygeal disorders, not elsewhere classified: Secondary | ICD-10-CM

## 2023-06-13 DIAGNOSIS — F17200 Nicotine dependence, unspecified, uncomplicated: Secondary | ICD-10-CM

## 2023-06-13 DIAGNOSIS — S93421A Sprain of deltoid ligament of right ankle, initial encounter: Secondary | ICD-10-CM

## 2023-06-13 DIAGNOSIS — Z6838 Body mass index (BMI) 38.0-38.9, adult: Secondary | ICD-10-CM

## 2023-06-13 DIAGNOSIS — Z2821 Immunization not carried out because of patient refusal: Secondary | ICD-10-CM

## 2023-06-13 DIAGNOSIS — E66812 Obesity, class 2: Secondary | ICD-10-CM

## 2023-06-13 DIAGNOSIS — E6609 Other obesity due to excess calories: Secondary | ICD-10-CM

## 2023-06-13 DIAGNOSIS — Z7689 Persons encountering health services in other specified circumstances: Secondary | ICD-10-CM

## 2023-06-13 DIAGNOSIS — R030 Elevated blood-pressure reading, without diagnosis of hypertension: Secondary | ICD-10-CM

## 2023-06-13 MED ORDER — NICOTINE 21 MG/24HR TD PT24: 21.0000 mg | MEDICATED_PATCH | Freq: Every day | TRANSDERMAL | 1 refills | Status: DC

## 2023-06-13 MED ORDER — CYCLOBENZAPRINE HCL 5 MG PO TABS: 5.0000 mg | ORAL_TABLET | Freq: Two times a day (BID) | ORAL | 0 refills | Status: AC | PRN

## 2023-06-13 NOTE — Progress Notes (Signed)
 Patient ID: Edward Klein, male    DOB: [birth year]  MRN: [id number]  CC: Establish Care (Est care / new pt./Swelling on R ankle due to fall on 06/04/23 while paying soccer/Discuss smoking cessation options/No to all vax. No to colonoscopy referral)   Subjective: Edward Klein is a 48 y.o. male who presents for new pt visit. Wife is with him. His concerns today include:   [employer] Language interpreter used during this encounter. #Donna [id number] Pt speaks english but at times he did not understand some of my questions so he had interpreter on stand by and used her intermittently.  No previous PCP.  No chronic med issues or meds.   Tob dep: smoked for >30 yrs. 1pk a day smoker.  Tried to quit many times in past; longest was for 1 yr cold [place].  Would like to quit. Would like to try patches.  Used before while in hosp and found helpful  C/o issues with RT ankle and lower back -was playing soccer with his kid 06/04/2023; tripped and fell spraining his RT ankle and injuring lower back; points to mid sacrum/coccyx area and to LT of it -reports pain, swelling and bruising to the ankle Lower back pain (ie sacrum/coccyx) when standing, sitting or bending over to pick up something. .  No radiation, no numbness or tingling, incontinence bowel or bladder.  -seen at [facility] for ankle 1 day later.  Reports x-rays negative for fx. Given what sounds like a splint; used for 1 day; swelling and pain is little better in ankle but lower back pain no better.  Given Tylenol  but did not use it.  Does not like taking med.   Rates sacral pain 5/10. Can walk for short period of time on ankle.  Out of work since 06/04/23.  Wants to know if he needs to be out further. Does a lot of lifting at work; works Holiday representative.  BP elev today.  No issues with BP in past.  Pt obese for height.  Wife reports he eats a lot of bread, drinks sodas and coffee and sweet arabic treats Request blood test to be done today.  HM:  decline  flu/PCV Patient Active Problem List   Diagnosis Date Noted   Class 2 obesity due to excess calories without serious comorbidity with body mass index (BMI) of 38.0 to 38.9 in adult 06/13/2023   Tobacco dependence 06/13/2023   Class 1 obesity 09/10/2021   Abscess, pharyngeal 09/08/2021   Right knee pain 05/14/2013   Left ankle pain 05/14/2013   Varicose veins 05/14/2013     No current outpatient medications on file prior to visit.   No current facility-administered medications on file prior to visit.    Not on File  Social History   Socioeconomic History   Marital status: Married    Spouse name: Not on file   Number of children: 2   Years of education: Not on file   Highest education level: Not on file  Occupational History   Occupation: Holiday representative  Tobacco Use   Smoking status: Every Day    Current packs/day: 1.00    Average packs/day: 1 pack/day for 25.0 years (25.0 ttl pk-yrs)    Types: Cigarettes   Smokeless tobacco: Never  Vaping Use   Vaping status: Never Used  Substance and Sexual Activity   Alcohol use: Never   Drug use: No   Sexual activity: Never  Other Topics Concern   Not on file  Social History Narrative   **  Merged History Encounter **       [place]rived in [place] :   - November 2014 as refugees from [place] War   - Spent 3 years in [place] in refugee camp before arriving in [place]       Language:   - Arabic   - Requires intepreter (essentially speaks no [place])      Occupation:   - 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        Contact:    - brothers and sisters speak [place]   Social Drivers of Health    Financial Resource Strain: Low Risk  (06/13/2023)   Overall Financial Resource Strain (CARDIA)    Difficulty of Paying Living Expenses: Not very hard  Food Insecurity: No Food Insecurity (06/13/2023)   Hunger Vital Sign    Worried About Running Out of Food in the Last Year: Never true    Ran Out of Food in the Last Year: Never true  Transportation Needs: No Transportation Needs (06/13/2023)   PRAPARE - Administrator, Civil Service (Medical): No    Lack of Transportation (Non-Medical): No  Physical Activity: Inactive (06/13/2023)   Exercise Vital Sign    Days of Exercise per Week: 0 days    Minutes of Exercise per Session: 0 min  Stress: No Stress Concern Present (06/13/2023)   [employer] of Occupational Health - Occupational Stress Questionnaire    Feeling of Stress : Not at all  Social Connections: Moderately Integrated (06/13/2023)   Social Connection and Isolation Panel [NHANES]    Frequency of Communication with Friends and Family: More than three times a week    Frequency of Social Gatherings with Friends and Family: More than three times a week    Attends Religious Services: 1 to 4 times per year    Active Member of [employer] or Organizations: No    Attends Banker Meetings: Never    Marital Status: Married  Catering manager Violence: Not At Risk (06/13/2023)   Humiliation, Afraid, Rape, and Kick questionnaire    Fear of Current or Ex-Partner: No    Emotionally Abused: No    Physically Abused: No    Sexually Abused: No    Family History  Problem Relation Age of Onset   Hypertension Mother    Healthy Father     Past Surgical History:  Procedure Laterality Date   INCISION AND DRAINAGE ABSCESS Left 09/08/2021   Procedure: Excision of left neck cyst;  Surgeon: Janita Mellow, MD;  Location: [facility] OR;  Service: ENT;  Laterality: Left;   KNEE ARTHROSCOPY WITH ANTERIOR CRUCIATE LIGAMENT (ACL) REPAIR Right 2010    ROS: Review of Systems Negative except  as stated above  PHYSICAL EXAM: BP 130/85   Pulse 76   Temp 98.1 F (36.7 C) (Oral)   Ht 5\' 10"  (1.778 m)   Wt 268 lb (121.6 kg)   SpO2 99%   BMI 38.45 kg/m   Physical Exam   General appearance - alert, well appearing, obese middle  age and in no distress Mental status - normal mood, behavior, speech, dress, motor activity, and thought processes Neck - supple, no significant adenopathy Chest - clear to auscultation, no wheezes, rales or rhonchi, symmetric air entry Heart - normal rate, regular rhythm, normal S1, S2, no murmurs, rubs, clicks or gallops Ext:  no edema in legs; + varicose veins Musculoskeletal - RT ankle: No edema noted at this time compared to the left ankle.  Mild tenderness on palpation over the lateral malleolus and medial malleolus.  Mild discomfort with inversion of the ankle. Mild tenderness on palpation of the lower sacral spine and coccyx spine.  Mild tenderness on palpation of the soft tissue of the left buttock around the area where the sacral spine is tender     Latest Ref Rng & Units 09/10/2021    1:05 AM 09/09/2021    4:53 AM 09/08/2021   12:07 AM  CMP  Glucose 70 - 99 mg/dL 89  161  89   BUN 6 - 20 mg/dL 17  18  20    Creatinine 0.61 - 1.24 mg/dL 0.96  0.45  4.09   Sodium 135 - 145 mmol/L 138  134  139   Potassium 3.5 - 5.1 mmol/L 3.8  4.0  3.6   Chloride 98 - 111 mmol/L 106  103  102   CO2 22 - 32 mmol/L 25  28  28    Calcium 8.9 - 10.3 mg/dL 8.5  8.8  9.4   Total Protein 6.5 - 8.1 g/dL  6.9    Total Bilirubin 0.3 - 1.2 mg/dL  0.4    Alkaline Phos 38 - 126 U/L  75    AST 15 - 41 U/L  15    ALT 0 - 44 U/L  26     Lipid Panel  No results found for: "CHOL", "TRIG", "HDL", "CHOLHDL", "VLDL", "LDLCALC", "LDLDIRECT"  CBC    Component Value Date/Time   WBC 15.4 (H) 09/10/2021 0105   RBC 5.33 09/10/2021 0105   HGB 14.5 09/10/2021 0105   HCT 43.9 09/10/2021 0105   PLT 362 09/10/2021 0105   MCV 82.4 09/10/2021 0105   MCH 27.2 09/10/2021 0105    MCHC 33.0 09/10/2021 0105   RDW 13.2 09/10/2021 0105   LYMPHSABS 3.4 09/09/2021 0453   MONOABS 1.2 (H) 09/09/2021 0453   EOSABS 0.0 09/09/2021 0453   BASOSABS 0.0 09/09/2021 0453      06/13/2023    9:58 AM 05/14/2013    3:00 PM  Depression screen PHQ 2/9  Decreased Interest 0 0  Down, Depressed, Hopeless 0 0  PHQ - 2 Score 0 0  Altered sleeping 0   Tired, decreased energy 0   Change in appetite 0   Feeling bad or failure about yourself  0   Trouble concentrating 0   Moving slowly or fidgety/restless 0   Suicidal thoughts 0   PHQ-9 Score 0   Difficult doing work/chores Not difficult at all      ASSESSMENT AND PLAN: 1. Establishing care with new doctor, encounter for (Primary)   2. Tobacco dependence Strongly advised to quit due to health risks associated with smoking.  He is wanting to give a trial of quitting.  Would like to try the nicotine  patches.  Went over the stepdown approach with him.  Given that he smokes 1 pack a day, we will start with the 21 mg patch. - nicotine  (NICODERM CQ  - DOSED IN MG/24 HOURS) 21 mg/24hr patch; Place 1 patch (  21 mg total) onto the skin daily.  Dispense: 28 patch; Refill: 1  3. Sprain of right medial ankle joint, initial encounter Pain and swelling has improved per patient.  I recommend using heat 2-3 times a day for 10 minutes and elevating when sitting or laying down.  4. Coccyx pain Recommend use of Tylenol  as needed for pain.  I also will give a muscle relaxant to use as needed.  Advised that it can cause drowsiness.  Do not take when he has to drive or operate any machinery.  Advised use of a heating pad. Note given keeping him out of work this wk with return on 06/20/23. - cyclobenzaprine  (FLEXERIL ) 5 MG tablet; Take 1 tablet (5 mg total) by mouth 2 (two) times daily as needed for muscle spasms.  Dispense: 20 tablet; Refill: 0 - DG Sacrum/Coccyx; Future  5. Elevated blood pressure reading in office without diagnosis of hypertension DASH  diet discussed and encouraged. Will have him follow-up in about 7 weeks for repeat check.  6. Class 2 obesity due to excess calories without serious comorbidity with body mass index (BMI) of 38.0 to 38.9 in adult Patient advised to eliminate sugary drinks from the diet, cut back on portion sizes especially of white carbohydrates, eat more white lean meat like chicken [place] and seafood instead of beef or pork and incorporate fresh fruits and vegetables into the diet daily. - CBC - Comprehensive metabolic panel - Lipid panel  7. Pneumococcal vaccination declined Recommended.  Patient declined.  8. Influenza vaccination declined      Patient was given the opportunity to ask questions.  Patient verbalized understanding of the plan and was able to repeat key elements of the plan.   This documentation was completed using Paediatric nurse.  Any transcriptional errors are unintentional.  Orders Placed This Encounter  Procedures   DG Sacrum/Coccyx   CBC   Comprehensive metabolic panel   Lipid panel     Requested Prescriptions   Signed Prescriptions Disp Refills   nicotine  (NICODERM CQ  - DOSED IN MG/24 HOURS) 21 mg/24hr patch 28 patch 1    Sig: Place 1 patch (21 mg total) onto the skin daily.   cyclobenzaprine  (FLEXERIL ) 5 MG tablet 20 tablet 0    Sig: Take 1 tablet (5 mg total) by mouth 2 (two) times daily as needed for muscle spasms.    Return in about 7 weeks (around 08/01/2023) for for recheck BP.  Concetta Dee, MD, FACP

## 2023-06-13 NOTE — Patient Instructions (Addendum)
Call [phone] to request nicotine  patches for free.  This is a state program that will provide the patches for free.   Use Tylenol  as needed for the back pain and ankle pain.  Use a heating pad to the ankle and to your lower back/tailbone.  Try to elevate the ankle when you are sitting or lying down.  Please go to [facility] imaging at [address] to get the x-rays done on the tailbone.  ???? ??? ??????? DASH Eating Plan ???? ??? (Dash) ??????? ??????? Dietary Approaches to Stop Hypertension ???? ???? ???? ????? ???? ??? ?????. ????? ??? ??????? ??? ????? ???? ???? ???????? ??:  ???? ?????? ??? ???? (?????? ????????).  ????? ??? ??????? ???? ?????? ?? ????? ?????? ?????? ????? ??????? ????????.  ???????? ?? ????? ??????. ?? ??????? ??????? ?????? ??? ?????? ????? ?????? ????????? ????????  ???? ?????? ??????? ????? ???? ????? (????????) ?? ????? ???????. ????? ??????? ???? ????? ??? ??? ?? 5 ?????? ?? ??? ???????? ???????. ????? ?????? ??? ??????? ???? ????? ??? ??? ?? 300 ??????? (????) ?? ????????? ?? ?? ??? ????? ?????? ???? ?????? ???????.  ????? ??? ?????? ???????? ???? ?? ???? "?????" ???? ???? ?? ????? ????????. ??????  ????? ???????? ??????? ????? "?????? ???" ?? "??? ????? ???".  ????? ??????? ???????. ???? ??????? ??????? ????????? ?????? ?? ??????? ????????. ?????  ???? ??? ????? ????? ??? ?????. ?????? ??????? ?? ??????? ??????? ?? ?????? ????? ?? ??? ?????? ?? ??? ??????. ???? ???? ??????? ?????? ?? ??????? ??? ??????? ????? ?????.  ?? ?????? ?????? ????? ??? ????? ?????? ????? ?? ???? ??? ????? ?????? ?????? ???????.  ???? ???????? ?????? ??????? ?????? ????? ???? ???????? ?????????? ??????????? ???? ??????? ???? ????? ?????. ????? ???????   ???? ?????? ??????? ????????. ???? ?? ????? ?? ???: ? ????? 4 ??? ?? ???? ?? ??????? ?4 ??? ?? ???? ?? ????????? ?? ????? ????? ??? ??? ???? ???????? ?????????. ? 6-8 ??? ?? ?????? ??????? ?? ???. ? 6 ??? ?? ??? ?? ???????  ????? ?????? ?? ?????? ?? ??????? ?? ??????? ?? ???. ????? ????? ??????? ????? ?????. ??????? ??? ?????? ??? ???? ????? ???? ????? ????? ???? ??? ???? ????? 3 ?????? (85 ??)? ??? ?????? ??????? ????? ????? ????? (28 ??). ? 2-3 ??? ?? ??????? ????? ????? ?? ???. ????? ??????? ???? ???? (237 ??). ? ??? ????? (1) ?? ???????? ?? ?????? ?? ????????? 5 ???? ?? ????????. ? 2-3 ??? ?? ?????? ?????? ?????. ???? ?????? ?????? ???? ???? ??????? ??????? ?????? 3 ?? ????? ??? ????? ????? ?????? ?????? ?????? ??????. ????? ??? ?????? ???? ?? ????? ?????? ??????? ??? ??????? ???????? ?????????.  ???? ?? ???? ??????? ???? ???? ??????? ??: ? ??????? ??????? ?? ???????. ? ??????? ???? ????? ??? ????? ????? ?? ?????? ??? ??????? ? ??? ??????? ???????. ? ??????? ???? ????? ??? ????? ????? ?? ?????? ???????? ??? ?????? ??????. ? ???????? ????????? ?????????? ??????? ?????? ???????? ?????? ?????? ????? ??????. ? ?????? ??????? ????? ?????.  ?? ??? ????? ??? ?????? ??? ?????.  ?? ???? ???? ?? 4 ???? ??? ????????.  ???? ????? ?? ?? ??? ?? ?????? (2) ???????? ?? ?????.  ???? ?? ????? ??????? ???????? ???? ?? ????? ????? ??????? ???????? ???????? ???????. ??? ??????  ??? ????? ?????? ?? ????? ???? ????? ????? ?? ????? ?? ?????? ???? ??? ?? ????.  ??? ??? ?????? ????????? ????????: ? ??? ???? ??????? ???? ???????? ??? ????? ??????: ? ????? ?? ??????? ?? ????? ??????? ?????? ?????? ??? ???? (??????). ? ????? ?? ??????? ?? ????? ??????? ?????? ??? ???? (??????). ? ???? ???? ?????? ?? ?? ????? ???????. ?? ???????? ???????? ????? ??????? ?????? ???? ???  12 ????? (355 ??) ?? ?????? ?? ??? ??? 5 ?????? (148 ??) ?? ?????? ?? ??? ??? ????? ???? (44 ??) ?? ????????? ???????? ??????. ??????? ????  ???? ????? ???? ?? 2300 ???? ?? ????? ??????. ??? ??? ????? ?? ??? ?????? ??? ????? ???? ????? ???? ???????? ???? ???????? ??? 1500 ???? ??????.  ????? ????? ??????? ?????? ??????? ?? ?????? ??? ???? ?? ?????? ?????? ?? ?????? ??? ???  ??????? ?????? ?? ???? ????? ?????? ??.  ?? ???? ???? ???????? ??? 30 ????? ??? ?????? ??????? ???????? ???????? ???? ???? ?? ???? ????? ????? ????? ????? ?? ??????? ?? ???? ???????.  ????? ????? ??????? ?????? ?? ??????? ??????? ?????? ????? ??????? ??????? ?? ????????? ?? ??????? ????????. ?? ??????? ???? ??? ????? ????????? ??????? ??????? ??????? ?? ??????? ?? ???????. ??????? ??????? ???????? ?? ?????? ??????? ???? ????? ????? ?????. ????????? ???????? ??????? ?? ???????? ???? ?????? ?? ??????? ??????? ?? ??????? ?? ????? ?? ???????. ????? ????????? ???????? ?????? ???????? ?? ????? ????????. ????????? ????? ??????? ?????? ???????? ?? ????? ????????. ????????? ??????? ?????? ???????? ?? ????? ????????. ??????: ??? ????? ?????? ?? ?????? ???????. ??????? ????? ?????? ?? ?????? ???????. ????? ?????. ???????. ???????. ??????. ???? ???????? ???????? ?? ?????? ??????? ?????? ????????. ????? ??????. ????????? ????? ????? ?????? ????????. ??? ????????? ?????? ??????. ?????? ??????????? ??????: ?????? ?? ????? ????? ?????? ????? ?????. ?????? ?? ????? ?????? ????? ?????. ????? ??????? ?????. ????? ?????????? ???????. ???? ?????. ????????? ?????? ????????? ?? ?????. ???????? ??? ??????? ????? ???????? ???????. ????? ?????? ?????? ?? ?????. ??? ????? ???? ????? ??? ????? ?? ??? ?? ????. ?????? ??????? ?? ?????? ????? ??????? ?? ???????? ?????? ???????? ??? ??????? ?? ????? ?????. ?????? ???????: ?????? ???? ????? (1%) ?? ?????? ?? ????? (????? ?????). ??????? ??????? ?? ?????? ?? ????? ?????? ?? ?????? ??????. ????? ?????? ?? ???????? ??????? ?? ????? ????? ????????. ????? ??????? ???? ????? ?? ?????? ?? ?????. ?????? ????? ????? ?? ??????? ?? ?????. ?????? ???????: ????? ??????? ??? ???? ??????. ???? ?????????. ????????? ??????? ??????? ??????? ?? ?????? ?? ?????? ?????? (?????? ????????). ???? ???????? ????? ????? ???????? ?????????? ???? ?????? ????? ??????. ?????????. ??????? ?????????: ???????.  ????????. ????? ??????? ??? ???. ??????? ??????: ?????? ?????? ????? ??? ??????. ???????? ??????? ?? ??????. ??????? ??????? ?? ????? ??? ???? ??????? ?????????? ???? ????? ???????. ??????? ??? ???? ?? ?????????? ????? ??????? ?????. ?? ??????? ?????? ??????? ??????? ??????? ??????? ?? ???? ??? ???? ?? ????. ??????? ???????. ??????? ????? ?????? ?? ???????. ????????? ???????? ??????? ??????? ?? ???????. ????????? ??????? ????? ?????. ????????? ??????? ??????? ??? ?????? ????? ???? ????? ???????? ?? ?????? ???????? ??????. ????? ????? ????? ??????? ??????? ??? ?????? ????? ???? ????? ???????? ?? ?????? ???????? ??????. ????? ????????? ???????? ??????? ??? ?????? ????? ???? ????? ???????? ?? ?????? ???????? ??????. ????. ???????. ??????: ????????? ???????? ???????? ??? ????????? ?? ?????? ?? ??? ????? ?????. ????? ????? ????????? ?? ????? ??????. ?????? ??????????? ??????: ??? ????? ?????? ??????. ???????. ?????? ???????. ??? ???????. ????????? ???????? ?????? ?? ?????? ??????? ?? ???????? ???????? ?? ????? ??????? ??? ????? ?????? ???? ????? ????????. ?????? ?? ????? ?????? ?? ??????? (??? ???????). ????????? (??? ?? ??? ??????? ???? ??????). ?????? ????????? ???????. ????????? ??????? ?? ????? ?????. ??????? ??????? ?? ???????. ????? ?????? ?? ???? ?????. ?????? ?? ????? ?????? ?? ?????. ?????? ???????: ?????? ???? ????? ?? ???? 2%? ??????? ?????? ??? ???. ????? ??????? ?????? ?? ???? ?????. ??????? ???? ????? ?? ??????. ??????? ????? ?????. ???? ?????? ?????? ?? ???????. ????? ???????. ????? ??????? ????????. ?????? ???????: ??????. ????? ?????. ???? (??? ?????). ???????? (??? ????? ???? ?????? ?? ???????? ?????????). ????? ????????. ???? ??? ???????. ?????? ??????????? ??? ??? ??? ????? ?? ???? ?????? ?? ??????. ??????? ?????????: ??? ????? ?????? ?????? ?????? ???? ??????? ???? ?????. ???? "???????". ???? ????????. ???? ??????. ???? ????????. ???? ??????? ????? ???? ?????? ?????? ????????  ??????. ???? ????? ?????. ??? ????? ?????? ???????. ???? ?????. ???? ??????. ???? ????????. ????? ????? ?? ???????. ???????. ???? ??????. ????? ??????? ??????. ?????? ??????. ?????? ?????? ????????. ????? ??????? ??????? ?????? ?? ???????. ??????? ?????? ??????? ?????? ?? ???????. ???????. ???? ?????? ???????. ??????? ??????: ?????? ?????? ????? ??????. ??????? ??????? ?? ????? ??? ???? ??????? ?????????? ???? ???? ??????. ??????? ??? ???? ?? ?????????? ????? ??????? ?????. ????? ?????? ??? ?????? ?? ?????????:  ?????? ?????? ????? ?????? ????? (  National Heart, Lung, and Blood Institute, NHLBI):? [email or web address]  ????? ????? ????????? (American Heart Association, AHA):? [email or web address]  ???????? ??????? ???????? ???????? (Academy of Nutrition and Dietetics):? [email or web address]  ??????? ??????? ????? ?([employer]) (NKF)? [email or web address] ??? ????? ?? ??? ????????? ?? ???? ?????? ????????? ???? ?????? ???? ??????? ??????. ???? ?? ?????? ??? ????? ???? ?? ???? ?? ???? ??????? ??????.? Document Revised: 06/02/2022 Document Reviewed: 06/02/2022 Elsevier Patient Education  2024 [employer].

## 2023-06-14 LAB — COMPREHENSIVE METABOLIC PANEL
ALT: 32 [IU]/L (ref 0–44)
AST: 18 [IU]/L (ref 0–40)
Albumin: 4.1 g/dL (ref 4.1–5.1)
Alkaline Phosphatase: 90 [IU]/L (ref 44–121)
BUN/Creatinine Ratio: 15 (ref 9–20)
BUN: 13 mg/dL (ref 6–24)
Bilirubin Total: 0.3 mg/dL (ref 0.0–1.2)
CO2: 22 mmol/L (ref 20–29)
Calcium: 9.5 mg/dL (ref 8.7–10.2)
Chloride: 101 mmol/L (ref 96–106)
Creatinine, Ser: 0.85 mg/dL (ref 0.76–1.27)
Globulin, Total: 2.9 g/dL (ref 1.5–4.5)
Glucose: 97 mg/dL (ref 70–99)
Potassium: 4.5 mmol/L (ref 3.5–5.2)
Sodium: 138 mmol/L (ref 134–144)
Total Protein: 7 g/dL (ref 6.0–8.5)
eGFR: 108 mL/min/{1.73_m2} (ref >59)

## 2023-06-14 LAB — CBC
Hematocrit: 49.5 % (ref 37.5–51.0)
Hemoglobin: 16.8 g/dL (ref 13.0–17.7)
MCH: 27.7 pg (ref 26.6–33.0)
MCHC: 33.9 g/dL (ref 31.5–35.7)
MCV: 82 fL (ref 79–97)
Platelets: 293 10*3/uL (ref 150–450)
RBC: 6.06 x10E6/uL — ABNORMAL HIGH (ref 4.14–5.80)
RDW: 13 % (ref 11.6–15.4)
WBC: 8.4 10*3/uL (ref 3.4–10.8)

## 2023-06-14 LAB — LIPID PANEL
Chol/HDL Ratio: 6.7 {ratio} — ABNORMAL HIGH (ref 0.0–5.0)
Cholesterol, Total: 269 mg/dL — ABNORMAL HIGH (ref 100–199)
HDL: 40 mg/dL (ref >39)
LDL Chol Calc (NIH): 168 mg/dL — ABNORMAL HIGH (ref 0–99)
Triglycerides: 321 mg/dL — ABNORMAL HIGH (ref 0–149)
VLDL Cholesterol Cal: 61 mg/dL — ABNORMAL HIGH (ref 5–40)

## 2023-06-15 NOTE — Progress Notes (Signed)
Blood cell counts are normal.  Kidney and liver function test normal. Cholesterol levels are elevated.  Healthy eating habits and regular exercise as discussed on recent visit will help to lower cholesterol. X-ray of the tail bone negative for fracture.

## 2023-06-24 ENCOUNTER — Ambulatory Visit (INDEPENDENT_AMBULATORY_CARE_PROVIDER_SITE_OTHER): Payer: Medicaid Other | Admitting: Podiatry

## 2023-06-24 DIAGNOSIS — M7751 Other enthesopathy of right foot: Secondary | ICD-10-CM | POA: Diagnosis not present

## 2023-06-24 NOTE — Progress Notes (Signed)
  Subjective:  Patient ID: Edward Klein, male    DOB: 02-Mar-1976,  MRN: [id number]  Chief Complaint  Patient presents with   Foot Pain    Pain in the heels    48 y.o. male presents with the above complaint.  Patient presents with right chronic ankle pain that has been going for quite some time is progressive gotten worse worse with ambulation worse with pressure he has not seen MRIs prior to seeing me pain scale is 5 out of 10 dull achy in nature he would like to discuss treatment options for it.  Denies any other acute issues.   Review of Systems: Negative except as noted in the HPI. Denies N/V/F/Ch.  Past Medical History:  Diagnosis Date   Closed left ankle fracture 2006   Never repaired    Current Outpatient Medications:    cyclobenzaprine (FLEXERIL) 5 MG tablet, Take 1 tablet (5 mg total) by mouth 2 (two) times daily as needed for muscle spasms., Disp: 20 tablet, Rfl: 0   nicotine (NICODERM CQ - DOSED IN MG/24 HOURS) 21 mg/24hr patch, Place 1 patch (21 mg total) onto the skin daily., Disp: 28 patch, Rfl: 1  Social History   Tobacco Use  Smoking Status Every Day   Current packs/day: 1.00   Average packs/day: 1 pack/day for 25.0 years (25.0 ttl pk-yrs)   Types: Cigarettes  Smokeless Tobacco Never    Not on File Objective:  There were no vitals filed for this visit. There is no height or weight on file to calculate BMI. Constitutional Well developed. Well nourished.  Vascular Dorsalis pedis pulses palpable bilaterally. Posterior tibial pulses palpable bilaterally. Capillary refill normal to all digits.  No cyanosis or clubbing noted. Pedal hair growth normal.  Neurologic Normal speech. Oriented to person, place, and time. Epicritic sensation to light touch grossly present bilaterally.  Dermatologic Nails well groomed and normal in appearance. No open wounds. No skin lesions.  Orthopedic: Pain on palpation to the right ankle pain with range of motion of the ankle  joint mildly more pain at the ATFL ligament no pain in the posterior tibial tendon.  Posterior tibial tendon no pain.  No pain at the peroneal tendons   Radiographs: None Assessment:   1. Capsulitis of ankle, right    Plan:  Patient was evaluated and treated and all questions answered.  Right chronic ankle instability/ankle capsulitis -Questions and concerns were discussed with the patient in extensive detail given the amount of pain that he is having he will benefit from steroid injection help decrease inflammatory component associate with pain patient agrees with the plan and would like to proceed with a steroid injection -Tri-Lock ankle brace was also dispensed -A steroid injection was performed at right ankle using 1% plain Lidocaine and 10 mg of Kenalog. This was well tolerated.   No follow-ups on file.  Right chronic ankle instability Tri-Lock ankle brace injection

## 2023-07-07 ENCOUNTER — Encounter: Payer: Self-pay | Admitting: Podiatry

## 2023-07-11 IMAGING — CT CT NECK W/ CM
4 of 5 series · 14 of 33 positions shown, 16 images · IV contrast (APPLIED)
Comparison: Recent CT from 09/03/2021.

CLINICAL DATA: Initial evaluation for acute sore throat, left-sided
neck pain.

EXAM:
CT NECK WITH CONTRAST
TECHNIQUE: Multidetector CT imaging of the neck was performed using the
standard protocol following the bolus administration of intravenous
contrast.

[Series 4: ax bone · axial · 0.39mm/px · z∈[-334,-198]mm · 3 of 136 slices shown, 4 images]
[im 34/136  soft-tissue]
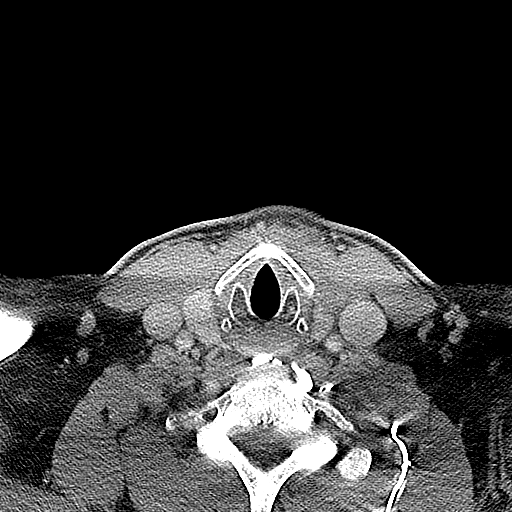
[im 34/136  bone]
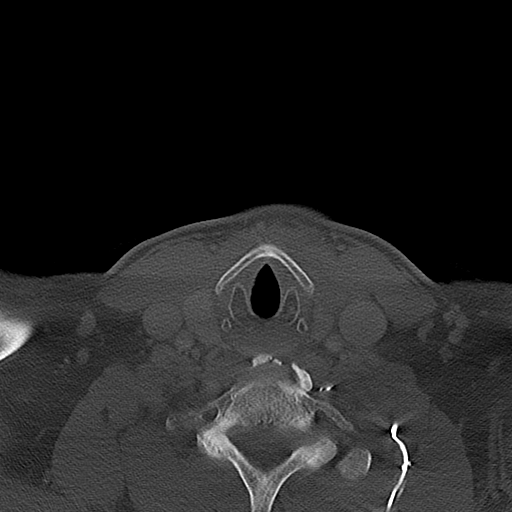
[im 68/136  bone]
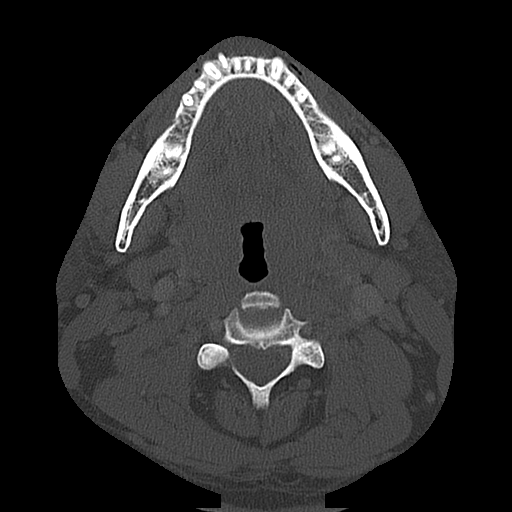
[im 102/136  bone]
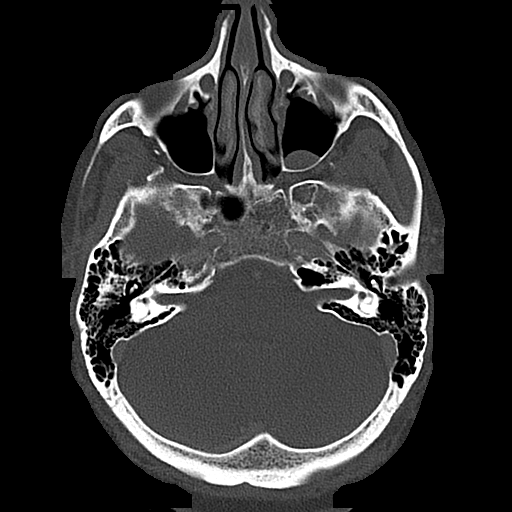

[Series 5: cor neck · coronal · 0.48mm/px · 3 of 126 slices shown]
[im 26/126  bone]
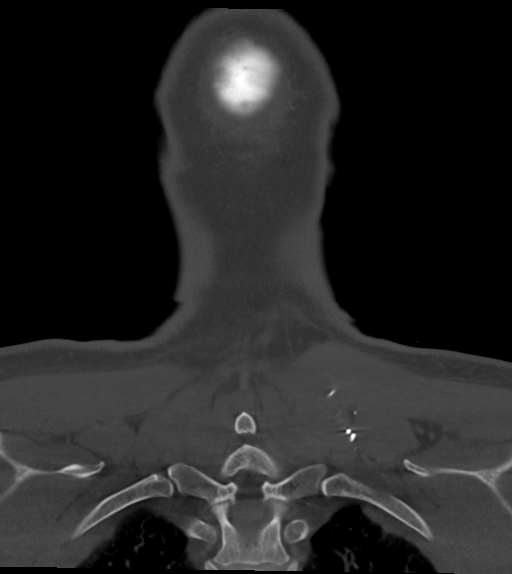
[im 51/126  bone]
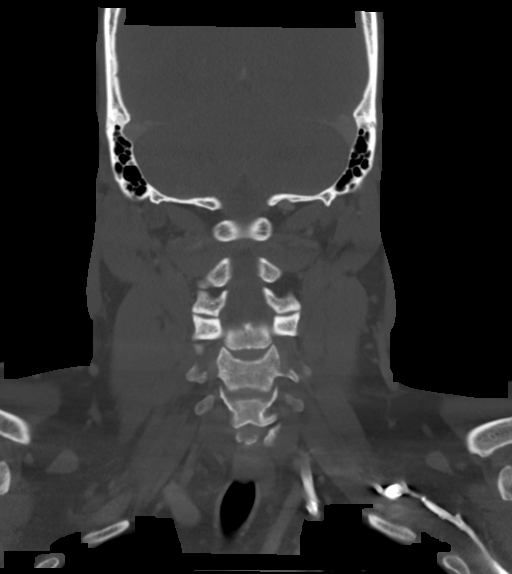
[im 76/126  bone]
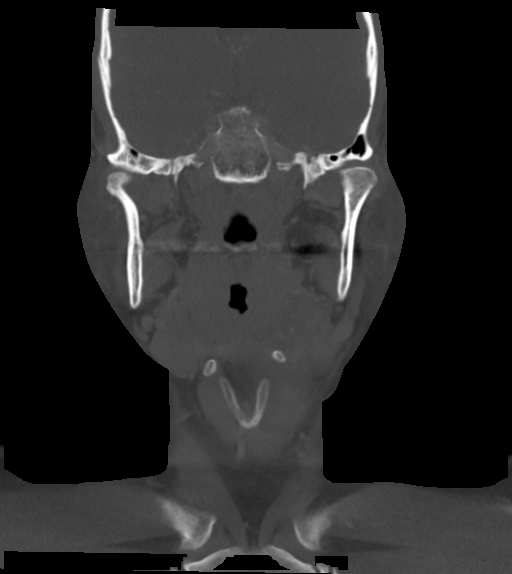

[Series 6: ax oropharynx · axial · 0.41mm/px · z∈[-335,-199]mm · 3 of 137 slices shown]
[im 35/137  bone]
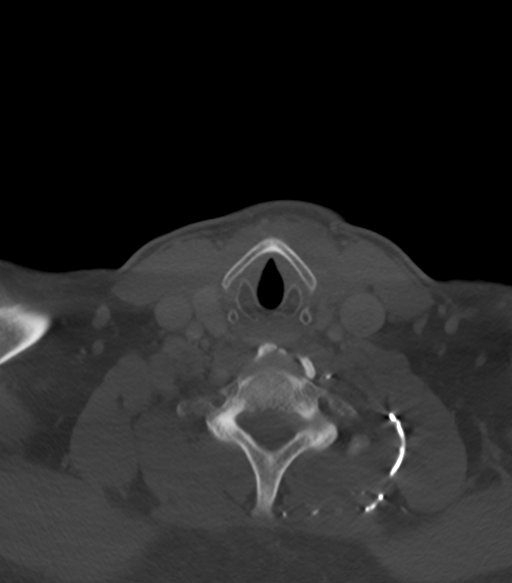
[im 69/137  bone]
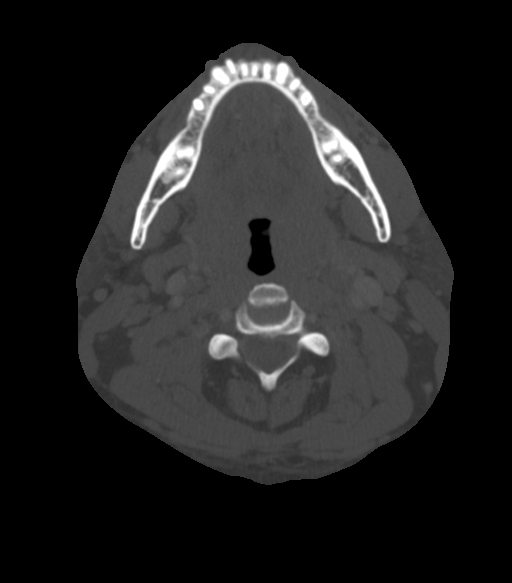
[im 103/137  bone]
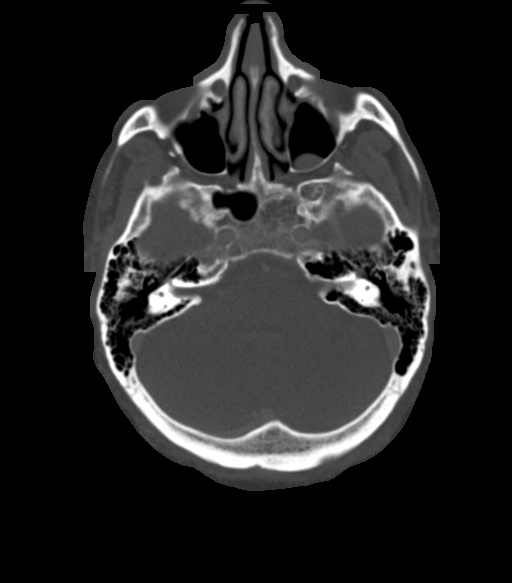

[Series 8: sag neck · sagittal · 0.54mm/px · 5 of 140 slices shown, 6 images]
[im 47/140  bone]
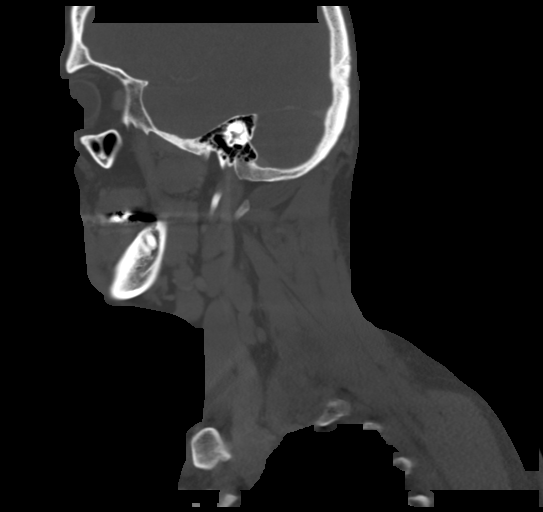
[im 58/140  bone]
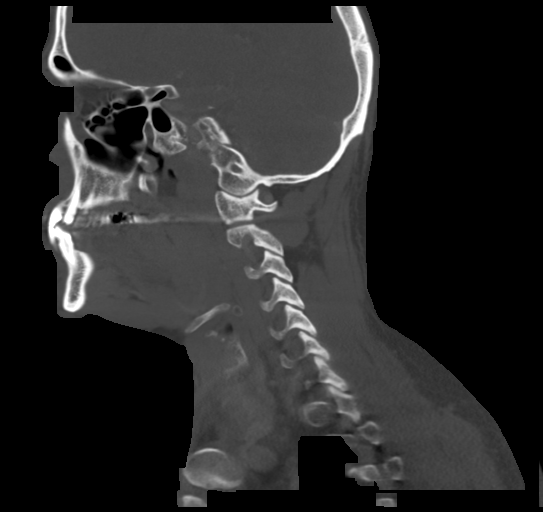
[im 70/140  soft-tissue]
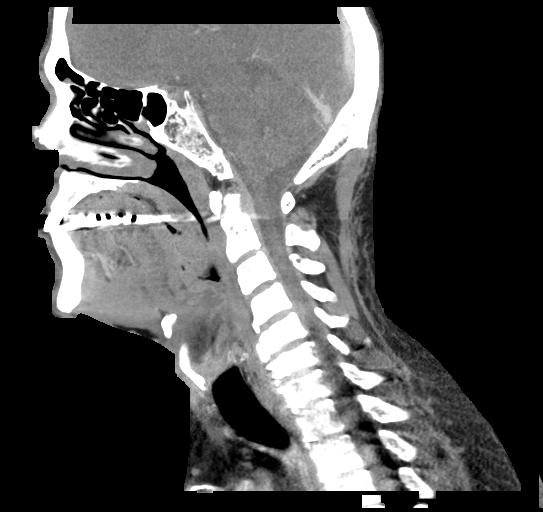
[im 70/140  bone]
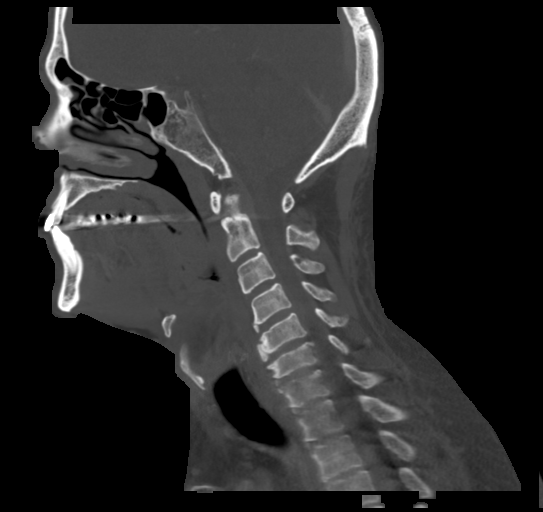
[im 82/140  bone]
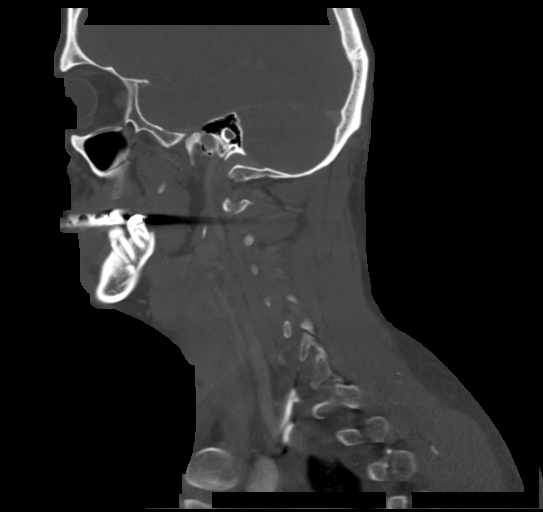
[im 93/140  bone]
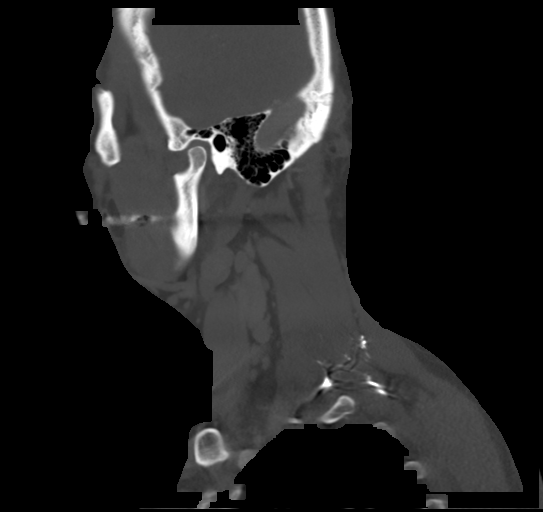

[14 of 33 positions shown; findings below may reference images not displayed]

RADIATION DOSE REDUCTION: This exam was performed according to the
departmental dose-optimization program which includes automated
exposure control, adjustment of the mA and/or kV according to
patient size and/or use of iterative reconstruction technique.

CONTRAST:  100mL OMNIPAQUE IOHEXOL 300 MG/ML  SOLN
FINDINGS: Pharynx and larynx: Oral cavity within normal limits. No acute
inflammatory changes seen about the dentition. Palatine tonsils
within normal limits. Negative nasopharynx.

Persistent edema and swelling involving the left pharyngeal mucosa,
consistent with acute pharyngitis. Associated swelling with
inflammatory stranding seen within the adjacent left parapharyngeal
and submandibular spaces. Changes most pronounced at the level of
the left piriform sinus. Thickening and edematous changes involving
the epiglottis and left aryepiglottic fold. There has been interval
development of a rim enhancing fluid collection interposed between
the left aspect of the hyoid and thyroid cartilages measuring 3.6 x
1.7 x 1.8 cm (series 5, image 72), consistent with abscess. Regional
swelling and edema with partial effacement of the supraglottic
airway, measuring 4 mm in transverse diameter at its most narrow
point. Changes are progressed from prior. Glottis within normal
limits inferiorly. Subglottic airway clear.

Salivary glands: Punctate sialolith noted within the right parotid
gland. Parotid and submandibular glands otherwise within normal
limits.

Thyroid: 1.8 cm right thyroid nodule again noted.

Lymph nodes: Left greater than right upper cervical adenopathy
measures up to 1.8 cm, presumably reactive.

Vascular: Normal intravascular enhancement seen throughout the neck.

Limited intracranial: Unremarkable.

Visualized orbits: Unremarkable.

Mastoids and visualized paranasal sinuses: Mild mucosal thickening
noted about the ethmoidal air cells and maxillary sinuses. Mastoid
air cells and middle ear cavities are well pneumatized and free of
fluid.

Skeleton: No discrete or worrisome osseous lesions. Mild-to-moderate
spondylosis present at C4-5 through C6-7.

Upper chest: Visualized upper chest demonstrates no acute finding.

Other: None.
IMPRESSION: 1. Findings consistent with persistent acute left
pharyngitis/supraglottitis, with interval development of a 3.6 x
x 1.8 cm abscess interposed between the left aspect of the hyoid and
thyroid cartilages. Regional swelling and edema with partial
effacement of the supraglottic airway, measuring 4 mm in transverse
diameter at its most narrow point. Changes have progressed from
prior.
2. Enlarged left greater than right cervical adenopathy, presumably
reactive.
3. 1.8 cm right thyroid nodule. Further evaluation with dedicated
thyroid ultrasound recommended. (Ref: [HOSPITAL]. [id number]

## 2023-07-22 ENCOUNTER — Encounter: Payer: Self-pay | Admitting: Podiatry

## 2023-07-22 ENCOUNTER — Ambulatory Visit: Payer: Medicaid Other | Admitting: Podiatry

## 2023-07-22 DIAGNOSIS — M7751 Other enthesopathy of right foot: Secondary | ICD-10-CM | POA: Diagnosis not present

## 2023-07-22 NOTE — Progress Notes (Signed)
  Subjective:  Patient ID: Edward Klein, male    DOB: 02-Aug-1975,  MRN: [id number]  Chief Complaint  Patient presents with   Capsulitis    Rm 8: ankle pain follow up. Pt in for f/u of Capsulitits of right ankle. Pt states he just has a little pain when he applies pressure to the outer aspect of the ankle but other than that no pain.    48 y.o. male presents with the above complaint.  Patient presents for follow-up of right chronic ankle pain.  He states doing a lot better injection helped.  Still some residual pain would like to discuss next treatment plan denies any other acute complaints   Review of Systems: Negative except as noted in the HPI. Denies N/V/F/Ch.  Past Medical History:  Diagnosis Date   Closed left ankle fracture 2006   Never repaired    Current Outpatient Medications:    cyclobenzaprine (FLEXERIL) 5 MG tablet, Take 1 tablet (5 mg total) by mouth 2 (two) times daily as needed for muscle spasms., Disp: 20 tablet, Rfl: 0   nicotine (NICODERM CQ - DOSED IN MG/24 HOURS) 21 mg/24hr patch, Place 1 patch (21 mg total) onto the skin daily., Disp: 28 patch, Rfl: 1  Social History   Tobacco Use  Smoking Status Every Day   Current packs/day: 1.00   Average packs/day: 1 pack/day for 25.0 years (25.0 ttl pk-yrs)   Types: Cigarettes  Smokeless Tobacco Never    Not on File Objective:  There were no vitals filed for this visit. There is no height or weight on file to calculate BMI. Constitutional Well developed. Well nourished.  Vascular Dorsalis pedis pulses palpable bilaterally. Posterior tibial pulses palpable bilaterally. Capillary refill normal to all digits.  No cyanosis or clubbing noted. Pedal hair growth normal.  Neurologic Normal speech. Oriented to person, place, and time. Epicritic sensation to light touch grossly present bilaterally.  Dermatologic Nails well groomed and normal in appearance. No open wounds. No skin lesions.  Orthopedic: Pain on  palpation to the right ankle pain with range of motion of the ankle joint mildly more pain at the ATFL ligament no pain in the posterior tibial tendon.  Posterior tibial tendon no pain.  No pain at the peroneal tendons   Radiographs: None Assessment:   No diagnosis found.  Plan:  Patient was evaluated and treated and all questions answered.  Right chronic ankle instability/ankle capsulitis -Questions and concerns were discussed with the patient in extensive detail given the amount of pain that he is having he will benefit from steroid injection help decrease inflammatory component associate with pain patient agrees with the plan and would like to proceed with a steroid injection -Tri-Lock ankle brace was also dispensed -A second steroid injection was performed at right ankle using 1% plain Lidocaine and 10 mg of Kenalog. This was well tolerated.   No follow-ups on file.  Right chronic ankle instability Tri-Lock ankle brace injection

## 2023-07-28 ENCOUNTER — Encounter: Payer: Self-pay | Admitting: Internal Medicine

## 2023-07-28 ENCOUNTER — Ambulatory Visit: Payer: Medicaid Other | Attending: Internal Medicine | Admitting: Internal Medicine

## 2023-07-28 VITALS — BP 127/83 | HR 71 | Ht 70.0 in | Wt 260.0 lb

## 2023-07-28 DIAGNOSIS — E66812 Obesity, class 2: Secondary | ICD-10-CM

## 2023-07-28 DIAGNOSIS — Z6837 Body mass index (BMI) 37.0-37.9, adult: Secondary | ICD-10-CM | POA: Diagnosis not present

## 2023-07-28 DIAGNOSIS — R030 Elevated blood-pressure reading, without diagnosis of hypertension: Secondary | ICD-10-CM

## 2023-07-28 DIAGNOSIS — E782 Mixed hyperlipidemia: Secondary | ICD-10-CM | POA: Diagnosis not present

## 2023-07-28 DIAGNOSIS — Z87891 Personal history of nicotine dependence: Secondary | ICD-10-CM

## 2023-07-28 DIAGNOSIS — Z1211 Encounter for screening for malignant neoplasm of colon: Secondary | ICD-10-CM

## 2023-07-28 MED ORDER — NICOTINE 21 MG/24HR TD PT24: 21.0000 mg | MEDICATED_PATCH | Freq: Every day | TRANSDERMAL | 1 refills | Status: AC

## 2023-07-28 NOTE — Patient Instructions (Addendum)
 Check blood pressure 1-2x/week, record readings and bring with you on next visit.  Dyslipidemia Dyslipidemia is an imbalance of waxy, fat-like substances (lipids) in the blood. The body needs lipids in small amounts. Dyslipidemia often involves a high level of cholesterol or triglycerides, which are types of lipids. Common forms of dyslipidemia include: High levels of LDL cholesterol. LDL is the type of cholesterol that causes fatty deposits (plaques) to build up in the blood vessels that carry blood away from the heart (arteries). Low levels of HDL cholesterol. HDL cholesterol is the type of cholesterol that protects against heart disease. High levels of HDL remove the LDL buildup from arteries. High levels of triglycerides. Triglycerides are a fatty substance in the blood that is linked to a buildup of plaques in the arteries. What are the causes? There are two main types of dyslipidemia: primary and secondary. Primary dyslipidemia is caused by changes (mutations) in genes that are passed down through families (inherited). These mutations cause several types of dyslipidemia. Secondary dyslipidemia may be caused by various risk factors that can lead to the disease, such as lifestyle choices and certain medical conditions. What increases the risk? You are more likely to develop this condition if you are an older man or if you are a woman who has gone through menopause. Other risk factors include: Having a family history of dyslipidemia. Taking certain medicines, including birth control pills, steroids, some diuretics, and beta-blockers. Eating a diet high in saturated fat. Smoking cigarettes or excessive alcohol intake. Having certain medical conditions such as diabetes, polycystic ovary syndrome (PCOS), kidney disease, liver disease, or hypothyroidism. Not exercising regularly. Being overweight or obese with too much belly fat. What are the signs or symptoms? In most cases, dyslipidemia does  not usually cause any symptoms. In severe cases, very high lipid levels can cause: Fatty bumps under the skin (xanthomas). A white or gray ring around the black center (pupil) of the eye. Very high triglyceride levels can cause inflammation of the pancreas (pancreatitis). How is this diagnosed? Your health care provider may diagnose dyslipidemia based on a routine blood test (fasting blood test). Because most people do not have symptoms of the condition, this blood testing (lipid profile) is done on adults age 4 and older and is repeated every 4-6 years. This test checks: Total cholesterol. This measures the total amount of cholesterol in your blood, including LDL cholesterol, HDL cholesterol, and triglycerides. A healthy number is below 200 mg/dL (1.61 mmol/L). LDL cholesterol. The target number for LDL cholesterol is different for each person, depending on individual risk factors. A healthy number is usually below 100 mg/dL (0.96 mmol/L). Ask your health care provider what your LDL cholesterol should be. HDL cholesterol. An HDL level of 60 mg/dL (0.45 mmol/L) or higher is best because it helps to protect against heart disease. A number below 40 mg/dL (4.09 mmol/L) for men or below 50 mg/dL (8.11 mmol/L) for women increases the risk for heart disease. Triglycerides. A healthy triglyceride number is below 150 mg/dL (9.14 mmol/L). If your lipid profile is abnormal, your health care provider may do other blood tests. How is this treated? Treatment depends on the type of dyslipidemia that you have and your other risk factors for heart disease and stroke. Your health care provider will have a target range for your lipid levels based on this information. Treatment for dyslipidemia starts with lifestyle changes, such as diet and exercise. Your health care provider may recommend that you: Get regular exercise. Make changes  to your diet. Quit smoking if you smoke. Limit your alcohol intake. If diet  changes and exercise do not help you reach your goals, your health care provider may also prescribe medicine to lower lipids. The most commonly prescribed type of medicine lowers your LDL cholesterol (statin drug). If you have a high triglyceride level, your provider may prescribe another type of drug (fibrate) or an omega-3 fish oil supplement, or both. Follow these instructions at home: Eating and drinking  Follow instructions from your health care provider or dietitian about eating or drinking restrictions. Eat a healthy diet as told by your health care provider. This can help you reach and maintain a healthy weight, lower your LDL cholesterol, and raise your HDL cholesterol. This may include: Limiting your calories, if you are overweight. Eating more fruits, vegetables, whole grains, fish, and lean meats. Limiting saturated fat, trans fat, and cholesterol. Do not drink alcohol if: Your health care provider tells you not to drink. You are pregnant, may be pregnant, or are planning to become pregnant. If you drink alcohol: Limit how much you have to: 0-1 drink a day for women. 0-2 drinks a day for men. Know how much alcohol is in your drink. In the [place] one drink equals one 12 oz bottle of beer (355 mL), one 5 oz glass of wine (148 mL), or one 1 oz glass of hard liquor (44 mL). Activity Get regular exercise. Start an exercise and strength training program as told by your health care provider. Ask your health care provider what activities are safe for you. Your health care provider may recommend: 30 minutes of aerobic activity 4-6 days a week. Brisk walking is an example of aerobic activity. Strength training 2 days a week. General instructions Do not use any products that contain nicotine or tobacco. These products include cigarettes, chewing tobacco, and vaping devices, such as e-cigarettes. If you need help quitting, ask your health care provider. Take over-the-counter and prescription  medicines only as told by your health care provider. This includes supplements. Keep all follow-up visits. This is important. Contact a health care provider if: You are having trouble sticking to your exercise or diet plan. You are struggling to quit smoking or to control your use of alcohol. Summary Dyslipidemia often involves a high level of cholesterol or triglycerides, which are types of lipids. Treatment depends on the type of dyslipidemia that you have and your other risk factors for heart disease and stroke. Treatment for dyslipidemia starts with lifestyle changes, such as diet and exercise. Your health care provider may prescribe medicine to lower lipids. This information is not intended to replace advice given to you by your health care provider. Make sure you discuss any questions you have with your health care provider. Document Revised: 11/20/2021 Document Reviewed: 06/23/2020 Elsevier Patient Education  2024 [employer].

## 2023-07-28 NOTE — Progress Notes (Signed)
 Patient ID: Edward Klein, male    DOB: 1975/05/13  MRN: [id number]  CC: Hypertension (HTN f/u. Med refill/No questions / concerns/No to flu vax)   Subjective: Edward Klein is a 48 y.o. male who presents for chronic ds management. Wife is with him. His concerns today include:  Hx of obesity, HL, tob dep  [employer] Language interpreter used during this encounter. #[id number]  BP elev on last visit at 130/85.  DASH discussed and he has been doing that. Checked BP about 2x since last visit - 116/70 and anther time was 130/? Tob dep: has been able to quit using the patches; request RF and would like to remain on the 21 mg for now Obesity: dietary counseling given on last visit.  Eating less due to Ramadan this mth.  Plans to continue eating less.  Very active working Holiday representative.  Wgh down 8 lbs since last visit 6 wks ago Patient Active Problem List   Diagnosis Date Noted   Class 2 obesity due to excess calories without serious comorbidity with body mass index (BMI) of 38.0 to 38.9 in adult 06/13/2023   Tobacco dependence 06/13/2023   Class 1 obesity 09/10/2021   Abscess, pharyngeal 09/08/2021   Right knee pain 05/14/2013   Left ankle pain 05/14/2013   Varicose veins 05/14/2013     Current Outpatient Medications on File Prior to Visit  Medication Sig Dispense Refill   cyclobenzaprine (FLEXERIL) 5 MG tablet Take 1 tablet (5 mg total) by mouth 2 (two) times daily as needed for muscle spasms. 20 tablet 0   nicotine (NICODERM CQ - DOSED IN MG/24 HOURS) 21 mg/24hr patch Place 1 patch (21 mg total) onto the skin daily. 28 patch 1   No current facility-administered medications on file prior to visit.    Not on File  Social History   Socioeconomic History   Marital status: Married    Spouse name: Not on file   Number of children: 2   Years of education: Not on file   Highest education level: Not on file  Occupational History   Occupation: Holiday representative  Tobacco Use   Smoking  status: Every Day    Current packs/day: 1.00    Average packs/day: 1 pack/day for 25.0 years (25.0 ttl pk-yrs)    Types: Cigarettes   Smokeless tobacco: Never  Vaping Use   Vaping status: Never Used  Substance and Sexual Activity   Alcohol use: Never   Drug use: No   Sexual activity: Never  Other Topics Concern   Not on file  Social History Narrative   ** Merged History Encounter **       Arrived in [place]:   - November 2014 as refugees from [place] War   - Spent 3 years in [place] in refugee camp before arriving in [place]      Language:   - Arabic   - Requires intepreter (essentially speaks no [place])      Occupation:   - 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        Contact:    - brothers and sisters speak [place]   Social Drivers of Health   Financial Resource Strain: Low Risk  (06/13/2023)   Overall Financial Resource Strain (CARDIA)    Difficulty of Paying Living Expenses: Not very hard  Food Insecurity: No Food Insecurity (06/13/2023)   Hunger Vital Sign    Worried About Running Out of Food in the Last Year: Never true    Ran Out of Food in the Last Year: Never true  Transportation Needs: No Transportation Needs (06/13/2023)   PRAPARE - Administrator, Civil Service (Medical): No    Lack of Transportation (Non-Medical): No  Physical Activity: Inactive (06/13/2023)   Exercise Vital Sign    Days of Exercise per Week: 0 days    Minutes of Exercise per Session: 0 min  Stress: No Stress Concern Present (06/13/2023)   [employer] of Occupational Health - Occupational Stress Questionnaire    Feeling of Stress : Not at  all  Social Connections: Moderately Integrated (06/13/2023)   Social Connection and Isolation Panel [NHANES]    Frequency of Communication with Friends and Family: More than three times a week    Frequency of Social Gatherings with Friends and Family: More than three times a week    Attends Religious Services: 1 to 4 times per year    Active Member of [employer] or Organizations: No    Attends Banker Meetings: Never    Marital Status: Married  Catering manager Violence: Not At Risk (06/13/2023)   Humiliation, Afraid, Rape, and Kick questionnaire    Fear of Current or Ex-Partner: No    Emotionally Abused: No    Physically Abused: No    Sexually Abused: No    Family History  Problem Relation Age of Onset   Hypertension Mother    Healthy Father     Past Surgical History:  Procedure Laterality Date   INCISION AND DRAINAGE ABSCESS Left 09/08/2021   Procedure: Excision of left neck cyst;  Surgeon: Serena Colonel, MD;  Location: [facility] OR;  Service: ENT;  Laterality: Left;   KNEE ARTHROSCOPY WITH ANTERIOR CRUCIATE LIGAMENT (ACL) REPAIR Right 2010    ROS: Review of Systems Negative except as stated above  PHYSICAL EXAM: BP 127/83 (BP Location: Left Arm, Patient Position: Sitting, Cuff Size: Large)   Pulse 71   Ht 5\' 10"  (1.778 m)   Wt 260 lb (117.9 kg)   SpO2 98%   BMI 37.31 kg/m   Wt Readings from Last 3 Encounters:  07/28/23 260 lb (117.9 kg)  06/13/23 268 lb (121.6 kg)  09/07/21 237 lb (107.5 kg)  BP 130/90  Physical Exam General appearance - alert, well appearing, and in no distress Mental status - normal mood, behavior, speech, dress, motor activity, and thought processes Chest - clear to auscultation, no wheezes, rales or rhonchi, symmetric air entry Heart - normal rate, regular rhythm, normal S1, S2, no murmurs, rubs, clicks or gallops       Latest Ref Rng & Units 06/13/2023    9:59 AM 09/10/2021    1:05 AM 09/09/2021    4:53 AM  CMP  Glucose 70 - 99 mg/dL 97   89  161   BUN 6 - 24 mg/dL 13  17  18    Creatinine 0.76 - 1.27 mg/dL 0.96  0.45  4.09   Sodium 134 - 144 mmol/L 138  138  134   Potassium 3.5 -  5.2 mmol/L 4.5  3.8  4.0   Chloride 96 - 106 mmol/L 101  106  103   CO2 20 - 29 mmol/L 22  25  28    Calcium 8.7 - 10.2 mg/dL 9.5  8.5  8.8   Total Protein 6.0 - 8.5 g/dL 7.0   6.9   Total Bilirubin 0.0 - 1.2 mg/dL 0.3   0.4   Alkaline Phos 44 - 121 IU/L 90   75   AST 0 - 40 IU/L 18   15   ALT 0 - 44 IU/L 32   26    Lipid Panel     Component Value Date/Time   CHOL 269 (H) 06/13/2023 0959   TRIG 321 (H) 06/13/2023 0959   HDL 40 06/13/2023 0959   CHOLHDL 6.7 (H) 06/13/2023 0959   LDLCALC 168 (H) 06/13/2023 0959    CBC    Component Value Date/Time   WBC 8.4 06/13/2023 0959   WBC 15.4 (H) 09/10/2021 0105   RBC 6.06 (H) 06/13/2023 0959   RBC 5.33 09/10/2021 0105   HGB 16.8 06/13/2023 0959   HCT 49.5 06/13/2023 0959   PLT 293 06/13/2023 0959   MCV 82 06/13/2023 0959   MCH 27.7 06/13/2023 0959   MCH 27.2 09/10/2021 0105   MCHC 33.9 06/13/2023 0959   MCHC 33.0 09/10/2021 0105   RDW 13.0 06/13/2023 0959   LYMPHSABS 3.4 09/09/2021 0453   MONOABS 1.2 (H) 09/09/2021 0453   EOSABS 0.0 09/09/2021 0453   BASOSABS 0.0 09/09/2021 0453    ASSESSMENT AND PLAN: 1. Former smoker Commended him on quitting.  Encouraged him to remain tobacco free.  Refill given on nicotine patches - nicotine (NICODERM CQ - DOSED IN MG/24 HOURS) 21 mg/24hr patch; Place 1 patch (21 mg total) onto the skin daily.  Dispense: 28 patch; Refill: 1  2. Mixed hyperlipidemia (Primary) Went over cholesterol results with him.  Discussed the importance of healthy eating habits and regular exercise to help lower cholesterol.  Printed information provided. We will plan to recheck on subsequent visit.  3. Class 2 severe obesity due to excess calories with serious comorbidity and body mass index (BMI) of 37.0 to 37.9 in adult [facility]) Commended him on 8 pound weight loss in the  past 6 weeks.  Encouraged him to continue eating smaller portions and to move is much as he can.  4. Elevated blood pressure reading in office without diagnosis of hypertension BP improved Continue DASH Prescription given for him to get a home blood pressure monitoring device.  Went over with him how to use it and taking the blood pressure.  Check twice a week and record readings.  Bring readings with him on next visit in 2 months - For home use only DME Other see comment  5. Screening for colon cancer Discussed colon cancer screening recommendation and methods.  He prefers to do the Cologuard test. - Cologuard   Patient was given the opportunity to ask questions.  Patient verbalized understanding of the plan and was able to repeat key elements of the plan.   This documentation was completed using Paediatric nurse.  Any transcriptional errors are unintentional.  No orders of the defined types were placed in this encounter.    Requested Prescriptions   Pending Prescriptions Disp Refills   nicotine (NICODERM CQ - DOSED IN MG/24 HOURS) 21 mg/24hr patch 28 patch 1    Sig: Place 1 patch (21 mg total) onto the skin daily.  cyclobenzaprine (FLEXERIL) 5 MG tablet 20 tablet 0    Sig: Take 1 tablet (5 mg total) by mouth 2 (two) times daily as needed for muscle spasms.    No follow-ups on file.  Jonah Blue, MD, FACP

## 2023-08-05 ENCOUNTER — Ambulatory Visit: Admitting: Podiatry

## 2023-08-05 ENCOUNTER — Encounter: Payer: Self-pay | Admitting: Podiatry

## 2023-08-05 DIAGNOSIS — L600 Ingrowing nail: Secondary | ICD-10-CM

## 2023-08-05 NOTE — Progress Notes (Signed)
  Subjective:  Patient ID: Edward Klein, male    DOB: March 14, 1976,  MRN: [id number]  Chief Complaint  Patient presents with   Ankle Pain    Follow up ankle instability right   "Doing better"   Nail Problem    Hallux right - wants whole nail removed    48 y.o. male presents with the above complaint.  Patient's presents with complaint of right hallux ingrown painful to touch is progressive gotten worse worse with ambulation worse with pressure shoe he would like to have it removed.  His bilateral ingrown nail border with underlying thickened nail he would like to have the whole nail removed in [facility] and denies any other acute complaints.  Pain scale 7 out of 10 dull aching nature   Review of Systems: Negative except as noted in the HPI. Denies N/V/F/Ch.  Past Medical History:  Diagnosis Date   Closed left ankle fracture 2006   Never repaired    Current Outpatient Medications:    cyclobenzaprine (FLEXERIL) 5 MG tablet, Take 1 tablet (5 mg total) by mouth 2 (two) times daily as needed for muscle spasms., Disp: 20 tablet, Rfl: 0   nicotine (NICODERM CQ - DOSED IN MG/24 HOURS) 21 mg/24hr patch, Place 1 patch (21 mg total) onto the skin daily., Disp: 28 patch, Rfl: 1  Social History   Tobacco Use  Smoking Status Former   Current packs/day: 0.00   Average packs/day: 1 pack/day for 25.0 years (25.0 ttl pk-yrs)   Types: Cigarettes   Quit date: 2025   Years since quitting: 0.2  Smokeless Tobacco Never    No Known Allergies Objective:  There were no vitals filed for this visit. There is no height or weight on file to calculate BMI. Constitutional Well developed. Well nourished.  Vascular Dorsalis pedis pulses palpable bilaterally. Posterior tibial pulses palpable bilaterally. Capillary refill normal to all digits.  No cyanosis or clubbing noted. Pedal hair growth normal.  Neurologic Normal speech. Oriented to person, place, and time. Epicritic sensation to light touch  grossly present bilaterally.  Dermatologic Pain on palpation of the entire/total nail on 1st digit of the right No other open wounds. No skin lesions.  Orthopedic: Normal joint ROM without pain or crepitus bilaterally. No visible deformities. No bony tenderness.   Radiographs: None Assessment:   1. Ingrown toenail of right foot    Plan:  Patient was evaluated and treated and all questions answered.  Nail contusion/dystrophy hallux, right with phenol -Patient elects to proceed with minor surgery to remove entire toenail today. Consent reviewed and signed by patient. -Entire/total nail excised. See procedure note. -Educated on post-procedure care including soaking. Written instructions provided and reviewed. -Patient to follow up in 2 weeks for nail check.  Procedure: Excision of entire/total nail with phenol nail matrix ectomy Location: Right 1st toe digit Anesthesia: Lidocaine 1% plain; 1.5 mL and Marcaine 0.5% plain; 1.5 mL, digital block. Skin Prep: Betadine. Dressing: Silvadene; telfa; dry, sterile, compression dressing. Technique: Following skin prep, the toe was exsanguinated and a tourniquet was secured at the base of the toe. The affected nail border was freed and excised. The tourniquet was then removed and sterile dressing applied. Disposition: Patient tolerated procedure well. Patient to return in 2 weeks for follow-up.   No follow-ups on file.

## 2023-08-10 ENCOUNTER — Emergency Department (HOSPITAL_BASED_OUTPATIENT_CLINIC_OR_DEPARTMENT_OTHER): Admitting: Radiology

## 2023-08-10 ENCOUNTER — Other Ambulatory Visit (HOSPITAL_BASED_OUTPATIENT_CLINIC_OR_DEPARTMENT_OTHER): Payer: Self-pay

## 2023-08-10 ENCOUNTER — Emergency Department (HOSPITAL_BASED_OUTPATIENT_CLINIC_OR_DEPARTMENT_OTHER)
Admission: EM | Admit: 2023-08-10 | Discharge: 2023-08-10 | Disposition: A | Discharge status: Home / Self Care | Attending: Emergency Medicine | Admitting: Emergency Medicine

## 2023-08-10 ENCOUNTER — Other Ambulatory Visit: Payer: Self-pay

## 2023-08-10 ENCOUNTER — Encounter (HOSPITAL_BASED_OUTPATIENT_CLINIC_OR_DEPARTMENT_OTHER): Payer: Self-pay | Admitting: Emergency Medicine

## 2023-08-10 DIAGNOSIS — M25562 Pain in left knee: Secondary | ICD-10-CM | POA: Diagnosis present

## 2023-08-10 MED ORDER — NAPROXEN 500 MG PO TABS
500.0000 mg | ORAL_TABLET | Freq: Two times a day (BID) | ORAL | 0 refills | Status: AC
  Filled 2023-08-10: qty 14, 7d supply, fill #0

## 2023-08-10 MED ORDER — NAPROXEN 250 MG PO TABS
500.0000 mg | ORAL_TABLET | Freq: Once | ORAL | Status: AC
Start: 2023-08-10 — End: 2023-08-10
  Administered 2023-08-10: 500 mg via ORAL
  Filled 2023-08-10: qty 2

## 2023-08-10 NOTE — ED Provider Notes (Signed)
 [place] EMERGENCY DEPARTMENT AT [facility] Provider Note   CSN: [id number] Arrival date & time: 08/10/23  1018     History  Chief Complaint  Patient presents with   Knee Pain    Edward Klein is a 48 y.o. male.  48 year old with a past medical history of right knee meniscus repair presents to the ED with a chief complaint of left knee pain has been ongoing for approximately 1 week.  He reports he was at work where he missed a step landing on his left knee.  Having pain with ambulation, going up the stairs, any type of movement.  He has taken some ibuprofen without any improvement in symptoms, has been applying ice to his knee but reports this looks more swollen that at baseline.  He was previously evaluated by orthopedics, however his physician now has retired.  He denies any fever, weakness, or other complaints.  The history is provided by the patient.  Knee Pain Associated symptoms: no fever        Home Medications Prior to Admission medications   Medication Sig Start Date End Date Taking? Authorizing Provider  methylPREDNISolone (MEDROL DOSEPAK) 4 MG TBPK tablet Taper as directed 08/08/23  Yes [provider]  naproxen (NAPROSYN) 500 MG tablet Take 1 tablet (500 mg total) by mouth 2 (two) times daily for 7 days. 08/10/23 08/17/23 Yes Elexis Pollak, Leonie Douglas, PA-C  triamcinolone ointment (KENALOG) 0.5 % Apply 1 Application topically 2 (two) times daily. 08/08/23  Yes [provider]  cyclobenzaprine (FLEXERIL) 5 MG tablet Take 1 tablet (5 mg total) by mouth 2 (two) times daily as needed for muscle spasms. 06/13/23   Marcine Matar, MD  nicotine (NICODERM CQ - DOSED IN MG/24 HOURS) 21 mg/24hr patch Place 1 patch (21 mg total) onto the skin daily. 07/28/23   Marcine Matar, MD      Allergies    Patient has no known allergies.    Review of Systems   Review of Systems  Constitutional:  Negative for fever.  Respiratory:  Negative for shortness of breath.    Musculoskeletal:  Positive for arthralgias. Negative for joint swelling.    Physical Exam Updated Vital Signs BP 135/88 (BP Location: Right Arm)   Pulse 71   Temp 98.6 F (37 C)   Resp 17   Ht 5\' 10"  (1.778 m)   Wt 117.9 kg   SpO2 100%   BMI 37.31 kg/m  Physical Exam Vitals and nursing note reviewed.  Constitutional:      Appearance: Normal appearance.  HENT:     Head: Normocephalic and atraumatic.     Mouth/Throat:     Mouth: Mucous membranes are moist.  Cardiovascular:     Rate and Rhythm: Normal rate.     Pulses:          Dorsalis pedis pulses are 2+ on the right side and 2+ on the left side.  Pulmonary:     Effort: Pulmonary effort is normal.  Abdominal:     General: Abdomen is flat.  Musculoskeletal:     Cervical back: Normal range of motion and neck supple.     Right knee: Normal. Normal pulse.     Left knee: Swelling present. Tenderness present over the medial joint line. Normal pulse.     Comments: Tenderness to palpation along the medial joint line.  Full flexion and extension but worsening pain with extension.  Neurovascularly intact.  Skin:    General: Skin is warm and dry.  Neurological:     Mental Status: He is alert and oriented to person, place, and time.     ED Results / Procedures / Treatments   Labs (all labs ordered are listed, but only abnormal results are displayed) Labs Reviewed - No data to display  EKG None  Radiology DG Knee Complete 4 Views Left Result Date: 08/10/2023 CLINICAL DATA:  Left knee pain after injury last week. EXAM: LEFT KNEE - COMPLETE 4+ VIEW COMPARISON:  None Available. FINDINGS: No evidence of fracture, dislocation, or joint effusion. No evidence of arthropathy or other focal bone abnormality. Soft tissues are unremarkable. IMPRESSION: Negative. Electronically Signed   By: Lupita Raider M.D.   On: 08/10/2023 12:31    Procedures Procedures    Medications Ordered in ED Medications  naproxen (NAPROSYN) tablet 500  mg (has no administration in time range)    ED Course/ Medical Decision Making/ A&P                                 Medical Decision Making Amount and/or Complexity of Data Reviewed Radiology: ordered.    Patient here with atraumatic left knee pain status post injury approximately 1 week ago.  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  On exam he is some pain along the medial aspect previous surgical repair to his right knee meniscus.  Given naproxen here, knee sleeve.  X-ray of his left knee did not show any acute fracture or dislocation.  He was ambulatory in the ED.  He agrees with plan and management.  Patient is hemodynamically stable for discharge.  Given referral for orthopedics.    Portions of this note were generated with Scientist, clinical (histocompatibility and immunogenetics). Dictation errors may occur despite best attempts at proofreading. \  Final Clinical Impression(s) / ED Diagnoses Final diagnoses:  Acute pain of left knee    Rx / DC Orders ED Discharge Orders          Ordered    naproxen (NAPROSYN) 500 MG tablet  2 times daily        08/10/23 1249              Claude Manges, PA-C 08/10/23 1256    Virgina Norfolk, DO 08/10/23 1321

## 2023-08-10 NOTE — Discharge Instructions (Signed)
 The x-ray on today's visit did not show any acute findings.  Placed on a short course of anti-inflammatories to help with your pain, please take 1 tablet twice a day for the next 7 days.  You were given a referral for orthopedics, please schedule an appointment at your earliest convenience.

## 2023-08-10 NOTE — ED Triage Notes (Signed)
 Pt via pov from home with left knee pain x 1 week. Pt states he hit a table and twisted his knee as he was moving. Pt able to bear weight and walk. No noticeable swelling at triage. Pt alert & oriented, nad noted.

## 2023-08-15 ENCOUNTER — Ambulatory Visit (HOSPITAL_BASED_OUTPATIENT_CLINIC_OR_DEPARTMENT_OTHER): Admitting: Student

## 2023-08-15 DIAGNOSIS — M25562 Pain in left knee: Secondary | ICD-10-CM

## 2023-08-15 NOTE — Progress Notes (Signed)
 Chief Complaint: Left knee pain    Discussed the use of AI scribe software for clinical note transcription with the patient, who gave verbal consent to proceed.  History of Present Illness Edward Klein is a 48 year old male that presents with left knee pain that is primarily located on the inside.  This began approximately 1 week ago after missing a step at work which caused a sudden twist to the knee.  The pain intensifies after seven to eight hours of work or after walking for a certain duration. The knee also tends to give out after prolonged use. The patient has been managing the pain with ice and a knee support, but the knee support was found to be unhelpful and has been discarded.  The patient does have a history of an ACL tear in the right knee 15 years ago.   Surgical History:   Right knee ACL repair 2010  PMH/PSH/Family History/Social History/Meds/Allergies:    Past Medical History:  Diagnosis Date   Closed left ankle fracture 2006   Never repaired   Past Surgical History:  Procedure Laterality Date   INCISION AND DRAINAGE ABSCESS Left 09/08/2021   Procedure: Excision of left neck cyst;  Surgeon: Serena Colonel, MD;  Location: [facility] OR;  Service: ENT;  Laterality: Left;   KNEE ARTHROSCOPY WITH ANTERIOR CRUCIATE LIGAMENT (ACL) REPAIR Right 2010   Social History   Socioeconomic History   Marital status: Married    Spouse name: Not on file   Number of children: 2   Years of education: Not on file   Highest education level: Not on file  Occupational History   Occupation: Holiday representative  Tobacco Use   Smoking status: Former    Current packs/day: 0.00    Average packs/day: 1 pack/day for 25.0 years (25.0 ttl pk-yrs)    Types: Cigarettes    Quit date: 2025    Years since quitting: 0.2   Smokeless tobacco: Never  Vaping Use   Vaping status: Never Used  Substance and Sexual Activity   Alcohol use: Never   Drug use: No   Sexual activity:  Never  Other Topics Concern   Not on file  Social History Narrative   ** Merged History Encounter **       Arrived in [place]:   - November 2014 as refugees from [place] War   - Spent 3 years in [place] in refugee camp before arriving in [place]      Language:   - Arabic   - Requires intepreter (essentially speaks no [place])      Occupation:   - 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        Contact:    - brothers and sisters speak [place]   Social Drivers of Health   Financial Resource Strain: Low Risk  (06/13/2023)   Overall Financial  Resource Strain (CARDIA)    Difficulty of Paying Living Expenses: Not very hard  Food Insecurity: No Food Insecurity (06/13/2023)   Hunger Vital Sign    Worried About Running Out of Food in the Last Year: Never true    Ran Out of Food in the Last Year: Never true  Transportation Needs: No Transportation Needs (06/13/2023)   PRAPARE - Administrator, Civil Service (Medical): No    Lack of Transportation (Non-Medical): No  Physical Activity: Inactive (06/13/2023)   Exercise Vital Sign    Days of Exercise per Week: 0 days    Minutes of Exercise per Session: 0 min  Stress: No Stress Concern Present (06/13/2023)   [employer] of Occupational Health - Occupational Stress Questionnaire    Feeling of Stress : Not at all  Social Connections: Moderately Integrated (06/13/2023)   Social Connection and Isolation Panel [NHANES]    Frequency of Communication with Friends and Family: More than three times a week    Frequency of Social Gatherings with Friends and Family: More than three times a week     Attends Religious Services: 1 to 4 times per year    Active Member of [employer] or Organizations: No    Attends Engineer, structural: Never    Marital Status: Married   Family History  Problem Relation Age of Onset   Hypertension Mother    Healthy Father    No Known Allergies Current Outpatient Medications  Medication Sig Dispense Refill   cyclobenzaprine (FLEXERIL) 5 MG tablet Take 1 tablet (5 mg total) by mouth 2 (two) times daily as needed for muscle spasms. 20 tablet 0   methylPREDNISolone (MEDROL DOSEPAK) 4 MG TBPK tablet Taper as directed     naproxen (NAPROSYN) 500 MG tablet Take 1 tablet (500 mg total) by mouth 2 (two) times daily for 7 days. 14 tablet 0   nicotine (NICODERM CQ - DOSED IN MG/24 HOURS) 21 mg/24hr patch Place 1 patch (21 mg total) onto the skin daily. 28 patch 1   triamcinolone ointment (KENALOG) 0.5 % Apply 1 Application topically 2 (two) times daily.     No current facility-administered medications for this visit.   No results found.  Review of Systems:   A ROS was performed including pertinent positives and negatives as documented in the HPI.  Physical Exam :   Constitutional: NAD and appears stated age Neurological: Alert and oriented Psych: Appropriate affect and cooperative There were no vitals taken for this visit.   Comprehensive Musculoskeletal Exam:    Left knee exam demonstrates tenderness to palpation over the medial joint line.  Mild soft tissue edema with no obvious deformity.  Active knee range of motion from 0 to 110 degrees.  No laxity with varus or valgus stress.  Negative Lachman and anterior/posterior drawer.  Positive McMurray.  Imaging:   Xray review from 08/10/2023 (left knee 4 views): No evidence of acute bony abnormality.  Joint spacing well-maintained.   I personally reviewed and interpreted the radiographs.      Assessment & Plan Left knee pain   Patient had a twisting injury approximately 1 week ago at work and is  experiencing medial knee pain.  X-rays show no evidence of bony abnormality.  An MRI is needed for further evaluation, particularly to evaluate for a meniscus tear. Order an MRI to [facility] for further assessment. Advise light duty work until MRI results are available and provide a note for work accommodations. Schedule a follow-up after  the MRI to discuss results and treatment.       I personally saw and evaluated the patient, and participated in the management and treatment plan.  Sharrell Deck, PA-C Orthopedics

## 2023-08-19 ENCOUNTER — Other Ambulatory Visit (HOSPITAL_BASED_OUTPATIENT_CLINIC_OR_DEPARTMENT_OTHER): Payer: Self-pay

## 2023-09-10 ENCOUNTER — Ambulatory Visit
Admission: RE | Admit: 2023-09-10 | Discharge: 2023-09-10 | Disposition: A | Discharge status: Home / Self Care | Source: Ambulatory Visit | Attending: Student | Admitting: Student

## 2023-09-10 DIAGNOSIS — M25562 Pain in left knee: Secondary | ICD-10-CM

## 2023-09-12 ENCOUNTER — Ambulatory Visit (HOSPITAL_BASED_OUTPATIENT_CLINIC_OR_DEPARTMENT_OTHER): Admitting: Student

## 2023-09-12 ENCOUNTER — Encounter (HOSPITAL_BASED_OUTPATIENT_CLINIC_OR_DEPARTMENT_OTHER): Payer: Self-pay | Admitting: Student

## 2023-09-12 DIAGNOSIS — M25562 Pain in left knee: Secondary | ICD-10-CM | POA: Diagnosis not present

## 2023-09-12 NOTE — Progress Notes (Signed)
 Chief Complaint: Left knee pain    History of Present Illness  09/12/23: Patient presents today for follow-up of the left knee as well as MRI review.  Overall he reports continued improvement in his symptoms.  Rates pain today at a 3/10 without use pain medication.  He has been on light duty at work, mostly Engineer, drilling.  The pain that he is still experiencing is still located over the medial knee.  08/15/23: Edward Klein is a 48 year old male that presents with left knee pain that is primarily located on the inside.  This began approximately 1 week ago after missing a step at work which caused a sudden twist to the knee.  The pain intensifies after seven to eight hours of work or after walking for a certain duration. The knee also tends to give out after prolonged use. The patient has been managing the pain with ice and a knee support, but the knee support was found to be unhelpful and has been discarded.  The patient does have a history of an ACL tear in the right knee 15 years ago.   Surgical History:   Right knee ACL repair 2010  PMH/PSH/Family History/Social History/Meds/Allergies:    Past Medical History:  Diagnosis Date   Closed left ankle fracture 2006   Never repaired   Past Surgical History:  Procedure Laterality Date   INCISION AND DRAINAGE ABSCESS Left 09/08/2021   Procedure: Excision of left neck cyst;  Surgeon: Janita Mellow, MD;  Location: [facility] OR;  Service: ENT;  Laterality: Left;   KNEE ARTHROSCOPY WITH ANTERIOR CRUCIATE LIGAMENT (ACL) REPAIR Right 2010   Social History   Socioeconomic History   Marital status: Married    Spouse name: Not on file   Number of children: 2   Years of education: Not on file   Highest education level: Not on file  Occupational History   Occupation: Holiday representative  Tobacco Use   Smoking status: Former    Current packs/day: 0.00    Average packs/day: 1 pack/day for 25.0 years (25.0  ttl pk-yrs)    Types: Cigarettes    Quit date: 2025    Years since quitting: 0.3   Smokeless tobacco: Never  Vaping Use   Vaping status: Never Used  Substance and Sexual Activity   Alcohol use: Never   Drug use: No   Sexual activity: Never  Other Topics Concern   Not on file  Social History Narrative   ** Merged History Encounter **       [place]rived in [place] :   - November 2014 as refugees from [place] War   - Spent 3 years in [place] in refugee camp before arriving in [place]       Language:   - Arabic   - Requires intepreter (essentially speaks no [place])      Occupation:   - 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        Contact:    - brothers and sisters speak [place]   Social Drivers of Health   Financial Resource Strain: Low Risk  (06/13/2023)   Overall Financial Resource Strain (CARDIA)    Difficulty of Paying Living Expenses: Not very hard  Food Insecurity: No Food Insecurity (06/13/2023)   Hunger Vital Sign    Worried About Running Out of Food in the Last Year: Never true    Ran Out of Food in the Last Year: Never true  Transportation Needs: No Transportation Needs (06/13/2023)   PRAPARE - Administrator, Civil Service (Medical): No    Lack of Transportation (Non-Medical): No  Physical Activity: Inactive (06/13/2023)   Exercise Vital Sign    Days of Exercise per Week: 0 days    Minutes of Exercise per Session: 0 min  Stress: No Stress Concern Present (06/13/2023)   [employer] of Occupational Health - Occupational Stress Questionnaire    Feeling of Stress : Not at all  Social Connections: Moderately Integrated  (06/13/2023)   Social Connection and Isolation Panel [NHANES]    Frequency of Communication with Friends and Family: More than three times a week    Frequency of Social Gatherings with Friends and Family: More than three times a week    Attends Religious Services: 1 to 4 times per year    Active Member of [employer] or Organizations: No    Attends Engineer, structural: Never    Marital Status: Married   Family History  Problem Relation Age of Onset   Hypertension Mother    Healthy Father    No Known Allergies Current Outpatient Medications  Medication Sig Dispense Refill   cyclobenzaprine  (FLEXERIL ) 5 MG tablet Take 1 tablet (5 mg total) by mouth 2 (two) times daily as needed for muscle spasms. 20 tablet 0   methylPREDNISolone (MEDROL DOSEPAK) 4 MG TBPK tablet Taper as directed     nicotine  (NICODERM CQ  - DOSED IN MG/24 HOURS) 21 mg/24hr patch Place 1 patch (21 mg total) onto the skin daily. 28 patch 1   triamcinolone  ointment (KENALOG) 0.5 % Apply 1 Application topically 2 (two) times daily.     No current facility-administered medications for this visit.   No results found.  Review of Systems:   A ROS was performed including pertinent positives and negatives as documented in the HPI.  Physical Exam :   Constitutional: NAD and appears stated age Neurological: Alert and oriented Psych: Appropriate affect and cooperative There were no vitals taken for this visit.   Comprehensive Musculoskeletal Exam:    Left knee exam demonstrates tenderness to palpation over the medial joint line.  Mild soft tissue edema with no obvious deformity.  Active knee range of motion from 0 to 120 degrees.  No laxity with varus or valgus stress.   Imaging:   MRI left knee: Possible small tear within the anterior horn of the lateral meniscus.  No evidence of ligamentous injury.  Small loose body noted medial to the patella within the joint space.    I personally reviewed and interpreted the  radiographs.      Assessment & Plan Left knee pain   Patient continues to have mild medial left knee pain after an injury just over 1 month ago, although symptoms have significantly improved.  MRI reviewed today does not show any evidence of a significant meniscal tear.  Discussed treatment options including trial of  NSAIDs versus cortisone injection.  Based on his relief thus far, he would like to continue without any further interventions, however if he experiences worsening once he returns full duty to work he very well will likely consider injection for relief.  Will plan to have him follow-up as needed.     I personally saw and evaluated the patient, and participated in the management and treatment plan.  Sharrell Deck, PA-C Orthopedics

## 2023-09-29 ENCOUNTER — Ambulatory Visit: Admitting: Internal Medicine

## 2023-12-28 ENCOUNTER — Ambulatory Visit (INDEPENDENT_AMBULATORY_CARE_PROVIDER_SITE_OTHER): Admitting: Physician Assistant

## 2023-12-28 ENCOUNTER — Encounter (HOSPITAL_BASED_OUTPATIENT_CLINIC_OR_DEPARTMENT_OTHER): Payer: Self-pay | Admitting: Physician Assistant

## 2023-12-28 DIAGNOSIS — M25562 Pain in left knee: Secondary | ICD-10-CM | POA: Diagnosis not present

## 2023-12-28 MED ORDER — LIDOCAINE HCL 1 % IJ SOLN
4.0000 mL | INTRAMUSCULAR | Status: AC | PRN
  Administered 2023-12-28: 4 mL

## 2023-12-28 MED ORDER — TRIAMCINOLONE ACETONIDE 40 MG/ML IJ SUSP
4.0000 mg | INTRAMUSCULAR | Status: AC | PRN
  Administered 2023-12-28: 4 mg via INTRA_ARTICULAR

## 2023-12-28 MED ORDER — METHYLPREDNISOLONE ACETATE 40 MG/ML IJ SUSP
40.0000 mg | INTRAMUSCULAR | Status: AC | PRN
  Administered 2023-12-28: 40 mg via INTRA_ARTICULAR

## 2023-12-28 NOTE — Progress Notes (Signed)
 Office Visit Note   Patient: Edward Klein           Date of Birth: April 06, 1976           MRN: [id number] Visit Date: 12/28/2023              Requested by: Vicci Barnie NOVAK, MD [address] [place],  [place] [address] PCP: Vicci Barnie NOVAK, MD  Chief Complaint  Patient presents with  . Left Knee - Pain      HPI: Patient is a pleasant 48 year old gentleman normally followed by [name].  He is status post an injury to his knee at his place of employment in April.  He was initially evaluated by [name].  He did not improve was had ordered an MRI.  Reviewed this with [name] which did not show any acute tears of his meniscus or his ligaments.  Did show a little bit of mucoid degeneration of the posterior horn medial meniscus.  Comes in today saying he is still having difficulties.  It has been suggested to him to try a shot and maybe physical therapy he like to do this at that point we will follow-up with [name] in 4 to 6 weeks  Assessment & Plan: Visit Diagnoses:  1. Acute pain of left knee     Plan: Will go forward with an injection today.  Will also place him in physical therapy unfortunately he said he had been terminated from his job but he would like a note saying that he was here today.  Follow-Up Instructions: Return in about 1 month (around 01/28/2024).   Ortho Exam  Patient is alert, oriented, no adenopathy, well-dressed, normal affect, normal respiratory effort. Left knee no effusion she is neurovascularly intact he has more tenderness medially than laterally compartment soft nontender no cellulitis    Imaging: No results found. No images are attached to the encounter.  Labs: Lab Results  Component Value Date   REPTSTATUS 09/13/2021 FINAL 09/08/2021   CULT  09/08/2021    NO GROWTH 5 DAYS Performed at [facility], [address], [place], [place] [address]      Lab Results  Component Value Date   ALBUMIN 4.1 06/13/2023   ALBUMIN 2.8  (L) 09/09/2021    Lab Results  Component Value Date   MG 1.9 09/09/2021   No results found for: VD25OH  No results found for: PREALBUMIN    Latest Ref Rng & Units 06/13/2023    9:59 AM 09/10/2021    1:05 AM 09/09/2021    4:53 AM  CBC EXTENDED  WBC 3.4 - 10.8 x10E3/uL 8.4  15.4  23.3   RBC 4.14 - 5.80 x10E6/uL 6.06  5.33  5.51   Hemoglobin 13.0 - 17.7 g/dL 83.1  85.4  84.8   HCT 37.5 - 51.0 % 49.5  43.9  45.3   Platelets 150 - 450 x10E3/uL 293  362  439   NEUT# 1.7 - 7.7 K/uL   18.6   Lymph# 0.7 - 4.0 K/uL   3.4      There is no height or weight on file to calculate BMI.  Orders:  Orders Placed This Encounter  Procedures  . Ambulatory referral to Physical Therapy   No orders of the defined types were placed in this encounter.    Procedures: Large Joint Inj: L knee on 12/28/2023 3:24 PM Indications: pain and diagnostic evaluation Details: 22 G 1.5 in needle, anteromedial approach  Arthrogram: No  Medications:  40 mg methylPREDNISolone  acetate 40 MG/ML; 4 mL lidocaine  1 %; 4 mg triamcinolone  acetonide 40 MG/ML Outcome: tolerated well, no immediate complications Procedure, treatment alternatives, risks and benefits explained, specific risks discussed. Consent was given by the patient.     Clinical Data: No additional findings.  ROS:  All other systems negative, except as noted in the HPI. Review of Systems  Objective: Vital Signs: There were no vitals taken for this visit.  Specialty Comments:  No specialty comments available.  PMFS History: Patient Active Problem List   Diagnosis Date Noted  . Pain in left knee 12/28/2023  . Mixed hyperlipidemia 07/28/2023  . Class 2 severe obesity due to excess calories with serious comorbidity and body mass index (BMI) of 37.0 to 37.9 in adult (HCC) 06/13/2023  . Tobacco dependence 06/13/2023  . Class 1 obesity 09/10/2021  . Abscess, pharyngeal 09/08/2021  . Right knee pain 05/14/2013  . Left ankle pain  05/14/2013  . Varicose veins 05/14/2013   Past Medical History:  Diagnosis Date  . Closed left ankle fracture 2006   Never repaired    Family History  Problem Relation Age of Onset  . Hypertension Mother   . Healthy Father     Past Surgical History:  Procedure Laterality Date  . INCISION AND DRAINAGE ABSCESS Left 09/08/2021   Procedure: Excision of left neck cyst;  Surgeon: Jesus Oliphant, MD;  Location: [facility] OR;  Service: ENT;  Laterality: Left;  . KNEE ARTHROSCOPY WITH ANTERIOR CRUCIATE LIGAMENT (ACL) REPAIR Right 2010   Social History   Occupational History  . Occupation: Holiday representative  Tobacco Use  . Smoking status: Former    Current packs/day: 0.00    Average packs/day: 1 pack/day for 25.0 years (25.0 ttl pk-yrs)    Types: Cigarettes    Quit date: 2025    Years since quitting: 0.6  . Smokeless tobacco: Never  Vaping Use  . Vaping status: Never Used  Substance and Sexual Activity  . Alcohol use: Never  . Drug use: No  . Sexual activity: Never

## 2024-02-04 ENCOUNTER — Encounter (HOSPITAL_BASED_OUTPATIENT_CLINIC_OR_DEPARTMENT_OTHER): Payer: Self-pay | Admitting: Rehabilitative and Restorative Service Providers"

## 2024-02-04 ENCOUNTER — Ambulatory Visit (HOSPITAL_BASED_OUTPATIENT_CLINIC_OR_DEPARTMENT_OTHER): Attending: Physician Assistant | Admitting: Rehabilitative and Restorative Service Providers"

## 2024-02-04 ENCOUNTER — Other Ambulatory Visit: Payer: Self-pay

## 2024-02-04 DIAGNOSIS — M25562 Pain in left knee: Secondary | ICD-10-CM | POA: Insufficient documentation

## 2024-02-04 DIAGNOSIS — M6281 Muscle weakness (generalized): Secondary | ICD-10-CM | POA: Diagnosis present

## 2024-02-04 DIAGNOSIS — G8929 Other chronic pain: Secondary | ICD-10-CM | POA: Diagnosis present

## 2024-02-04 NOTE — Therapy (Addendum)
 OUTPATIENT PHYSICAL THERAPY EVALUATION   Patient Name: Edward Klein MRN: [id number] DOB:04/01/1976, 48 y.o., male Today's Date: 02/04/2024  END OF SESSION:  PT End of Session - 02/04/24 1212     Visit Number 1    Number of Visits 12    Date for Recertification  04/28/24    Authorization Type WC    PT Start Time 0802    PT Stop Time 0850    PT Time Calculation (min) 48 min    Activity Tolerance Patient tolerated treatment well;Patient limited by pain    Behavior During Therapy [facility] for tasks assessed/performed          Past Medical History:  Diagnosis Date   Closed left ankle fracture 2006   Never repaired   Past Surgical History:  Procedure Laterality Date   INCISION AND DRAINAGE ABSCESS Left 09/08/2021   Procedure: Excision of left neck cyst;  Surgeon: Jesus Oliphant, MD;  Location: [facility] OR;  Service: ENT;  Laterality: Left;   KNEE ARTHROSCOPY WITH ANTERIOR CRUCIATE LIGAMENT (ACL) REPAIR Right 2010   Patient Active Problem List   Diagnosis Date Noted   Pain in left knee 12/28/2023   Mixed hyperlipidemia 07/28/2023   Class 2 severe obesity due to excess calories with serious comorbidity and body mass index (BMI) of 37.0 to 37.9 in adult 06/13/2023   Tobacco dependence 06/13/2023   Class 1 obesity 09/10/2021   Abscess, pharyngeal 09/08/2021   Right knee pain 05/14/2013   Left ankle pain 05/14/2013   Varicose veins 05/14/2013    REFERRING PROVIDER: Persons, Ronal Dragon, PA   REFERRING DIAG: [phone] (ICD-10-CM) - Acute pain of left knee   Rationale for Evaluation and Treatment: Rehabilitation  THERAPY DIAG:  Chronic pain of left knee  Muscle weakness (generalized)  ONSET DATE: 08/03/23   SUBJECTIVE:                                                                                                                                                                                           SUBJECTIVE STATEMENT: The doctors gave me muscle relaxers and ice and did an MRI.  The dr told me I could go back to work and the job told me no. They laid me off 09/19/23. 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   PAIN:  Are you having pain? Yes: NPRS scale: 5/10 Pain location: L knee (medial) Pain description: sharp Aggravating factors: standing and sitting Relieving factors: cold  PRECAUTIONS:  Other: 2010 R knee scope with ACL repair; don't lift heavy items more than 10-20 lbs per pt report  RED FLAGS: None   WEIGHT BEARING RESTRICTIONS:  No  FALLS:  Has patient fallen in last 6 months? 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  DIAGNOSTIC FINDINGS:   08/10/23 Xray: No evidence of fracture, dislocation, or joint effusion. No evidence of arthropathy or other focal bone abnormality. Soft tissues are unremarkable.  09/10/23 MRI: Bones: There is no fracture or contusion pattern. There is minimal chondromalacia without accelerated arthrosis. No full-thickness cartilage defect is identified. No significant joint effusion is present. The extensor mechanism is intact. Loculated likely benign intra-articular ganglion cyst adjacent to the anterior joint line centrally. This is of uncertain clinical significance.   Ligaments: The ACL, PCL, MCL and fibular collateral ligament are intact.   Menisci: Lateral meniscus is unremarkable. Medial meniscus is unremarkable. Minimal mu  PATIENT SURVEYS:  LEFS  Extreme difficulty/unable (0), Quite a bit of difficulty (1), Moderate difficulty (2), Little difficulty (3), No difficulty (4) Survey date:     Any of your usual work, housework or school activities   2. Usual hobbies, recreational or sporting activities   3. Getting into/out of the bath   4. Walking between rooms   5. Putting on socks/shoes   6. Squatting    7. Lifting an object, like a bag of groceries from the floor   8. Performing light activities around your home   9. Performing heavy activities around your home   10. Getting into/out of a car   11. Walking 2 blocks   12. Walking 1 mile   13. Going up/down 10 stairs (1 flight)   14. Standing for 1 hour   15.  sitting for 1 hour   16. Running on even ground   17. Running on uneven ground   18. Making sharp turns while running fast   19. Hopping    20. Rolling over in bed   Score total:  38     COGNITIVE STATUS: Within functional limits for tasks assessed   SENSATION: WFL  EDEMA:  Slight peripatella L  POSTURE:  ambulates with a wide base of support   GAIT: Distance walked: in gym Assistive device utilized: none Level of assistance: Complete Independence Comments: ambulates with a wide base of support  Body Part #1 Knee  PALPATION: Tender along medial L joint line; slight swelling noted peripatella; no TTP at patella tendon; patella mobility good.  LOWER EXTREMITY ROM:     Active  Right eval Left eval  Hip flexion    Hip extension    Hip abduction    Hip adduction    Hip internal rotation    Hip external rotation    Knee flexion 125 121  Knee extension 0 1  Ankle dorsiflexion    Ankle plantarflexion    Ankle inversion    Ankle eversion     (Blank rows = not tested)   LOWER EXTREMITY MMT:    MMT Right eval Left eval  Hip flexion 29.8 28  Hip extension    Hip abduction    Hip adduction (done in sitting) 18 20  Hip internal rotation    Hip external rotation  Knee flexion 26.5 25.7  Knee extension 28.4 19.5  Ankle dorsiflexion    Ankle plantarflexion    Ankle inversion    Ankle eversion     (Blank rows = not tested)     SPECIAL TESTS:  Lower Extremity Knee special tests: Anterior drawer test: negative, Posterior drawer test: negative, Lachman Test: negative, McMurray's test: negative, and Pivot shift test: negative. SLR - for extension lag but noticeable tremors with all phases. Pt had a difficulty time relaxing during tests but did not report any of the tests to cause pain; PT unable to fully assess test due to pt inability to relax into position.  TREATMENT DATE:  02/04/24: Evaluation performed; Attempted quad set and SLR L but pt reports feeling pinch under inferior pole of patella; therefore, discontinued. Pt was noted to have extensive shaking of Quad in performing SLR compared to R. Pt was able to perform ball squeeze without pain; therefore, issued as HEP. Evaluation history took extra time due to usage of interpreter and understanding mechanism of injury.      PATIENT EDUCATION:  Education details: reviewed POC, see HEP Person educated: Patient and interpreter Education method: Explanation, Demonstration, Tactile cues, Verbal cues, and Handouts Education comprehension: verbalized understanding and returned demonstration  HOME EXERCISE PROGRAM: Access Code: [email or web address] URL: [email or web address] Date: 02/04/2024 Prepared by: Alger Ada  Exercises - Supine Hip Adduction Isometric with Ball  - 1 x daily - 7 x weekly - 1 sets - 20 reps   ASSESSMENT:  CLINICAL IMPRESSION: Patient is a 48 y.o. male who was seen today for physical therapy evaluation and treatment for L knee pain after work related injury 08/03/23. Pt reports L knee pain along medial joint line and medial patella. Pt demonstrates good L knee ROM but demonstrates L knee weakness especially with knee extension and is unable to perform quad set or SLR without reports of discomfort along L patella inferior pole. Pt would benefit from PT for L knee strengthening with emphasis on quad, techniques to reduce L inferior patella pole  pinching, and pain management to address L medial joint line pain.    OBJECTIVE IMPAIRMENTS: Abnormal gait, decreased mobility, difficulty walking, decreased strength, hypomobility, and pain.   ACTIVITY LIMITATIONS: sitting, standing, squatting, stairs, and locomotion level  PARTICIPATION LIMITATIONS: community activity and overall mobility  PERSONAL FACTORS: Fitness are also affecting patient's functional outcome.   REHAB POTENTIAL: Good  CLINICAL DECISION MAKING: Stable/uncomplicated  EVALUATION COMPLEXITY: Low   GOALS: Goals reviewed with patient? Yes  SHORT TERM GOALS: Target date: 03/03/24  Pt will be independent with initial HEP to assist with pain management and L knee strengthening Baseline: Goal status: INITIAL  2.  Pt will be able to perform quad set and SLR without inferior patella pole L pinching to assist with quad strengthening Baseline:  Goal status: INITIAL  3.  Pt will be able to squat with 25% less L knee pain to perform functional activities Baseline:  Goal status: INITIAL    LONG TERM GOALS: Target date: 04/28/24  Pt will be independent with advanced HEP to assist with strengthening and pain management L knee Baseline:  Goal status: INITIAL  2.  Pt will demonstrate improvement in L knee extension strength >/=10 points to assist with functional activities Baseline:  Knee extension 28.4 19.5   Goal status: INITIAL  3.  Pt will be able to stand >/= 15 minutes without L knee pain to be able to perform functional activities Baseline:  Goal status: INITIAL  4.  Pt will be able to transfer sit to stand without L knee pain 75% of the time Baseline:  Goal status: INITIAL  5.  Pt will be able to sit >/=30 minutes with </=3/10 L knee pain to eat dinner Baseline:  Goal status: INITIAL     PLAN:  PT FREQUENCY: 1x/week  PT DURATION: 12 weeks  PLANNED INTERVENTIONS: 97164- PT Re-evaluation, 97110-Therapeutic exercises, 97530- Therapeutic  activity, [id number]- Neuromuscular re-education, 97140- Manual therapy, [id number]- Gait training, 02964- Ultrasound, 02966- Ionotophoresis 4mg /ml Dexamethasone , Patient/Family education, Stair training, and Taping.  PLAN FOR NEXT SESSION: review HEP, try McConnell Taping to lift L patella inferior pole prior to doing quad set and SLR, progress HEP   Alger Ada, PT 02/04/2024, 12:29 PM

## 2024-02-08 ENCOUNTER — Ambulatory Visit (HOSPITAL_BASED_OUTPATIENT_CLINIC_OR_DEPARTMENT_OTHER): Admitting: Physical Therapy

## 2024-02-10 ENCOUNTER — Ambulatory Visit: Admitting: Podiatry

## 2024-02-11 ENCOUNTER — Encounter (HOSPITAL_BASED_OUTPATIENT_CLINIC_OR_DEPARTMENT_OTHER): Payer: Self-pay | Admitting: Physical Therapy

## 2024-02-11 ENCOUNTER — Ambulatory Visit (HOSPITAL_BASED_OUTPATIENT_CLINIC_OR_DEPARTMENT_OTHER): Admitting: Physical Therapy

## 2024-02-11 DIAGNOSIS — G8929 Other chronic pain: Secondary | ICD-10-CM

## 2024-02-11 DIAGNOSIS — M25562 Pain in left knee: Secondary | ICD-10-CM | POA: Diagnosis not present

## 2024-02-11 DIAGNOSIS — M6281 Muscle weakness (generalized): Secondary | ICD-10-CM

## 2024-02-11 NOTE — Therapy (Addendum)
 OUTPATIENT PHYSICAL THERAPY TREATMENT   Patient Name: Edward Klein MRN: [id number] DOB:October 02, 1975, 48 y.o., male Today's Date: 02/11/2024  END OF SESSION:   PT End of Session - 02/11/24 1053     Visit Number 2    Number of Visits 12    Date for Recertification  04/28/24    Authorization Type WC    PT Start Time 1053    PT Stop Time 1131    PT Time Calculation (min) 38 min    Activity Tolerance Patient tolerated treatment well;Patient limited by pain    Behavior During Therapy [facility] for tasks assessed/performed           Past Medical History:  Diagnosis Date   Closed left ankle fracture 2006   Never repaired   Past Surgical History:  Procedure Laterality Date   INCISION AND DRAINAGE ABSCESS Left 09/08/2021   Procedure: Excision of left neck cyst;  Surgeon: Jesus Oliphant, MD;  Location: [facility] OR;  Service: ENT;  Laterality: Left;   KNEE ARTHROSCOPY WITH ANTERIOR CRUCIATE LIGAMENT (ACL) REPAIR Right 2010   Patient Active Problem List   Diagnosis Date Noted   Pain in left knee 12/28/2023   Mixed hyperlipidemia 07/28/2023   Class 2 severe obesity due to excess calories with serious comorbidity and body mass index (BMI) of 37.0 to 37.9 in adult 06/13/2023   Tobacco dependence 06/13/2023   Class 1 obesity 09/10/2021   Abscess, pharyngeal 09/08/2021   Right knee pain 05/14/2013   Left ankle pain 05/14/2013   Varicose veins 05/14/2013    REFERRING PROVIDER: Persons, Ronal Dragon, PA   REFERRING DIAG: [phone] (ICD-10-CM) - Acute pain of left knee   Rationale for Evaluation and Treatment: Rehabilitation  THERAPY DIAG:  Chronic pain of left knee  Muscle weakness (generalized)  ONSET DATE: 08/03/23   SUBJECTIVE:                                                                                                                                                                                           SUBJECTIVE STATEMENT: No pain on arrival. Doing well today. What triggers the  pain mostly is work or effort.. 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   PAIN:  Are you having pain? Yes: NPRS scale: 5/10 Pain location: L knee (medial) Pain description: sharp Aggravating factors: standing and sitting Relieving factors: cold  PRECAUTIONS:  Other: 2010 R knee scope with ACL repair; don't lift heavy items more  than 10-20 lbs per pt report  RED FLAGS: None   WEIGHT BEARING RESTRICTIONS:  No  FALLS:  Has patient fallen in last 6 months? 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  DIAGNOSTIC FINDINGS:   08/10/23 Xray: No evidence of fracture, dislocation, or joint effusion. No evidence of arthropathy or other focal bone abnormality. Soft tissues are unremarkable.  09/10/23 MRI: Bones: There is no fracture or contusion pattern. There is minimal chondromalacia without accelerated arthrosis. No full-thickness cartilage defect is identified. No significant joint effusion is present. The extensor mechanism is intact. Loculated likely benign intra-articular ganglion cyst adjacent to the anterior joint line centrally. This is of uncertain clinical significance.   Ligaments: The ACL, PCL, MCL and fibular collateral ligament are intact.   Menisci: Lateral meniscus is unremarkable. Medial meniscus is unremarkable. Minimal mu  PATIENT SURVEYS:  LEFS  Extreme difficulty/unable (0), Quite a bit of difficulty (1), Moderate difficulty (2), Little difficulty (3), No difficulty (4) Survey date:    Any of your usual work, housework or school activities    2. Usual hobbies, recreational or sporting activities   3. Getting into/out of the bath   4. Walking between rooms   5. Putting on socks/shoes   6. Squatting    7. Lifting an object, like a bag of groceries from the floor   8. Performing light activities around your home   9. Performing heavy activities around your home   10. Getting into/out of a car   11. Walking 2 blocks   12. Walking 1 mile   13. Going up/down 10 stairs (1 flight)   14. Standing for 1 hour   15.  sitting for 1 hour   16. Running on even ground   17. Running on uneven ground   18. Making sharp turns while running fast   19. Hopping    20. Rolling over in bed   Score total:  38     COGNITIVE STATUS: Within functional limits for tasks assessed   SENSATION: WFL  EDEMA:  Slight peripatella L  POSTURE:  ambulates with a wide base of support   GAIT: Distance walked: in gym Assistive device utilized: none Level of assistance: Complete Independence Comments: ambulates with a wide base of support  Body Part #1 Knee  PALPATION: Tender along medial L joint line; slight swelling noted peripatella; no TTP at patella tendon; patella mobility good.  LOWER EXTREMITY ROM:     Active  Right eval Left eval  Hip flexion    Hip extension    Hip abduction    Hip adduction    Hip internal rotation    Hip external rotation    Knee flexion 125 121  Knee extension 0 1  Ankle dorsiflexion    Ankle plantarflexion    Ankle inversion    Ankle eversion     (Blank rows = not tested)   LOWER EXTREMITY MMT:    MMT Right eval Left eval  Hip flexion 29.8 28  Hip extension    Hip abduction    Hip adduction (done in sitting) 18 20  Hip internal rotation    Hip external rotation    Knee flexion 26.5 25.7  Knee extension 28.4 19.5  Ankle dorsiflexion  Ankle plantarflexion    Ankle inversion    Ankle eversion     (Blank rows = not tested)    SPECIAL TESTS:  Lower Extremity Knee special tests:  Anterior drawer test: negative, Posterior drawer test: negative, Lachman Test: negative, McMurray's test: negative, and Pivot shift test: negative. SLR - for extension lag but noticeable tremors with all phases. Pt had a difficulty time relaxing during tests but did not report any of the tests to cause pain; PT unable to fully assess test due to pt inability to relax into position.  TREATMENT DATE:  02/11/24 Grade I-II medical and lateral patellar mobs  STM to gastroc and soleus Quad sets 2x10  Active hamstring stretch x5  Calf stretch 3x30 seconds on 1/2 foam roller  Hip abduction in side lying 3x10 Glute bridges 3x10  02/04/24: Evaluation performed; Attempted quad set and SLR L but pt reports feeling pinch under inferior pole of patella; therefore, discontinued. Pt was noted to have extensive shaking of Quad in performing SLR compared to R. Pt was able to perform ball squeeze without pain; therefore, issued as HEP. Evaluation history took extra time due to usage of interpreter and understanding mechanism of injury.   PATIENT EDUCATION:  Education details: relevant anatomy and activity modification to decrease pain Person educated: Patient and interpreter Education method: Explanation, Demonstration, Tactile cues, Verbal cues, and Handouts Education comprehension: verbalized understanding and returned demonstration  HOME EXERCISE PROGRAM: Access Code: [email or web address] URL: [email or web address] Date: 02/04/2024 Prepared by: Alger Ada  Exercises - Supine Hip Adduction Isometric with Ball  - 1 x daily - 7 x weekly - 1 sets - 20 reps   ASSESSMENT:  CLINICAL IMPRESSION: Patient tolerated exercises well but is limited by pain. Utilized manual therapy to decrease hyperactive musculature and decrease pain. Patient demonstrated positive thessaly test during treatment. Exercises focused on quad and glute strengthening to improve activity tolerance. Pt educated on relevant anatomy and  expected soreness. Verbal cueing for core activation and proper body mechanics to improve safety. Updated HEP. Will continue to benefit from therapy to address remaining limitations and return to prior level of function.   OBJECTIVE IMPAIRMENTS: Abnormal gait, decreased mobility, difficulty walking, decreased strength, hypomobility, and pain.   ACTIVITY LIMITATIONS: sitting, standing, squatting, stairs, and locomotion level  PARTICIPATION LIMITATIONS: community activity and overall mobility  PERSONAL FACTORS: Fitness are also affecting patient's functional outcome.   REHAB POTENTIAL: Good  CLINICAL DECISION MAKING: Stable/uncomplicated  EVALUATION COMPLEXITY: Low   GOALS: Goals reviewed with patient? Yes  SHORT TERM GOALS: Target date: 03/03/24  Pt will be independent with initial HEP to assist with pain management and L knee strengthening Baseline: Goal status: INITIAL  2.  Pt will be able to perform quad set and SLR without inferior patella pole L pinching to assist with quad strengthening Baseline:  Goal status: INITIAL  3.  Pt will be able to squat with 25% less L knee pain to perform functional activities Baseline:  Goal status: INITIAL    LONG TERM GOALS: Target date: 04/28/24  Pt will be independent with advanced HEP to assist with strengthening and pain management L knee Baseline:  Goal status: INITIAL  2.  Pt will demonstrate improvement in L knee extension strength >/=10 points to assist with functional activities Baseline:  Knee extension 28.4 19.5   Goal status: INITIAL  3.  Pt will be able to stand >/= 15 minutes without L knee pain to be able to perform functional activities Baseline:  Goal status:  INITIAL  4.  Pt will be able to transfer sit to stand without L knee pain 75% of the time Baseline:  Goal status: INITIAL  5.  Pt will be able to sit >/=30 minutes with </=3/10 L knee pain to eat dinner Baseline:  Goal status: INITIAL  PLAN:  PT  FREQUENCY: 1x/week  PT DURATION: 12 weeks  PLANNED INTERVENTIONS: 97164- PT Re-evaluation, 97110-Therapeutic exercises, 97530- Therapeutic activity, [id number]- Neuromuscular re-education, 97140- Manual therapy, [id number]- Gait training, 02964- Ultrasound, 02966- Ionotophoresis 4mg /ml Dexamethasone , Patient/Family education, Stair training, and Taping.  PLAN FOR NEXT SESSION: review HEP, try McConnell Taping to lift L patella inferior pole prior to doing quad set and SLR, progress HEP   Lili Finder, Student-PT 02/11/2024, 10:54 AM   This entire session was performed under direct supervision and direction of a licensed therapist/therapist assistant . I have personally read, edited and approve of the note as written. 11:35 AM, 02/11/24 Prentice CANDIE Stains PT, DPT Physical Therapist at [facility]

## 2024-02-18 ENCOUNTER — Encounter (HOSPITAL_BASED_OUTPATIENT_CLINIC_OR_DEPARTMENT_OTHER): Payer: Self-pay | Admitting: Rehabilitative and Restorative Service Providers"

## 2024-02-18 ENCOUNTER — Ambulatory Visit (HOSPITAL_BASED_OUTPATIENT_CLINIC_OR_DEPARTMENT_OTHER): Admitting: Rehabilitative and Restorative Service Providers"

## 2024-02-18 DIAGNOSIS — M25562 Pain in left knee: Secondary | ICD-10-CM | POA: Diagnosis not present

## 2024-02-18 DIAGNOSIS — M6281 Muscle weakness (generalized): Secondary | ICD-10-CM

## 2024-02-18 DIAGNOSIS — G8929 Other chronic pain: Secondary | ICD-10-CM

## 2024-02-18 NOTE — Therapy (Signed)
 OUTPATIENT PHYSICAL THERAPY TREATMENT   Patient Name: Edward Klein MRN: [id number] DOB:April 07, 1976, 48 y.o., male Today's Date: 02/18/2024  END OF SESSION:   PT End of Session - 02/18/24 0849     Visit Number 3    Number of Visits 12    Date for Recertification  04/28/24    Authorization Type WC    PT Start Time 0845    PT Stop Time 0931    PT Time Calculation (min) 46 min    Activity Tolerance Patient tolerated treatment well    Behavior During Therapy [facility] for tasks assessed/performed            Past Medical History:  Diagnosis Date   Closed left ankle fracture 2006   Never repaired   Past Surgical History:  Procedure Laterality Date   INCISION AND DRAINAGE ABSCESS Left 09/08/2021   Procedure: Excision of left neck cyst;  Surgeon: Jesus Oliphant, MD;  Location: [facility] OR;  Service: ENT;  Laterality: Left;   KNEE ARTHROSCOPY WITH ANTERIOR CRUCIATE LIGAMENT (ACL) REPAIR Right 2010   Patient Active Problem List   Diagnosis Date Noted   Pain in left knee 12/28/2023   Mixed hyperlipidemia 07/28/2023   Class 2 severe obesity due to excess calories with serious comorbidity and body mass index (BMI) of 37.0 to 37.9 in adult 06/13/2023   Tobacco dependence 06/13/2023   Class 1 obesity 09/10/2021   Abscess, pharyngeal 09/08/2021   Right knee pain 05/14/2013   Left ankle pain 05/14/2013   Varicose veins 05/14/2013    REFERRING PROVIDER: Persons, Ronal Dragon, PA   REFERRING DIAG: [phone] (ICD-10-CM) - Acute pain of left knee   Rationale for Evaluation and Treatment: Rehabilitation  THERAPY DIAG:  Chronic pain of left knee  Muscle weakness (generalized)  ONSET DATE: 08/03/23   SUBJECTIVE:                                                                                                                                                                                           SUBJECTIVE STATEMENT: I have been doing my exercises. I am still feeling inside the bone pain.  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   PAIN:  Are you having pain? Yes: NPRS scale: 5/10 Pain location: L knee (medial) Pain description: sharp Aggravating factors: standing and sitting Relieving factors: cold  PRECAUTIONS:  Other: 2010 R knee scope with ACL repair; don't lift heavy items more than 10-20 lbs per  pt report  RED FLAGS: None   WEIGHT BEARING RESTRICTIONS:  No  FALLS:  Has patient fallen in last 6 months? 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  DIAGNOSTIC FINDINGS:   08/10/23 Xray: No evidence of fracture, dislocation, or joint effusion. No evidence of arthropathy or other focal bone abnormality. Soft tissues are unremarkable.  09/10/23 MRI: Bones: There is no fracture or contusion pattern. There is minimal chondromalacia without accelerated arthrosis. No full-thickness cartilage defect is identified. No significant joint effusion is present. The extensor mechanism is intact. Loculated likely benign intra-articular ganglion cyst adjacent to the anterior joint line centrally. This is of uncertain clinical significance.   Ligaments: The ACL, PCL, MCL and fibular collateral ligament are intact.   Menisci: Lateral meniscus is unremarkable. Medial meniscus is unremarkable. Minimal mu  PATIENT SURVEYS:  LEFS  Extreme difficulty/unable (0), Quite a bit of difficulty (1), Moderate difficulty (2), Little difficulty (3), No difficulty (4) Survey date:    Any of your usual work, housework or school activities   2. Usual hobbies, recreational  or sporting activities   3. Getting into/out of the bath   4. Walking between rooms   5. Putting on socks/shoes   6. Squatting    7. Lifting an object, like a bag of groceries from the floor   8. Performing light activities around your home   9. Performing heavy activities around your home   10. Getting into/out of a car   11. Walking 2 blocks   12. Walking 1 mile   13. Going up/down 10 stairs (1 flight)   14. Standing for 1 hour   15.  sitting for 1 hour   16. Running on even ground   17. Running on uneven ground   18. Making sharp turns while running fast   19. Hopping    20. Rolling over in bed   Score total:  38     COGNITIVE STATUS: Within functional limits for tasks assessed   SENSATION: WFL  EDEMA:  Slight peripatella L  POSTURE:  ambulates with a wide base of support   GAIT: Distance walked: in gym Assistive device utilized: none Level of assistance: Complete Independence Comments: ambulates with a wide base of support  Body Part #1 Knee  PALPATION: Tender along medial L joint line; slight swelling noted peripatella; no TTP at patella tendon; patella mobility good.  LOWER EXTREMITY ROM:     Active  Right eval Left eval  Hip flexion    Hip extension    Hip abduction    Hip adduction    Hip internal rotation    Hip external rotation    Knee flexion 125 121  Knee extension 0 1  Ankle dorsiflexion    Ankle plantarflexion    Ankle inversion    Ankle eversion     (Blank rows = not tested)   LOWER EXTREMITY MMT:    MMT Right eval Left eval  Hip flexion 29.8 28  Hip extension    Hip abduction    Hip adduction (done in sitting) 18 20  Hip internal rotation    Hip external rotation    Knee flexion 26.5 25.7  Knee extension 28.4 19.5  Ankle dorsiflexion    Ankle  plantarflexion    Ankle inversion    Ankle eversion     (Blank rows = not tested)    SPECIAL TESTS:  Lower Extremity Knee special tests: Anterior drawer test: negative,  Posterior drawer test: negative, Lachman Test: negative, McMurray's test: negative, and Pivot shift test: negative. SLR - for extension lag but noticeable tremors with all phases. Pt had a difficulty time relaxing during tests but did not report any of the tests to cause pain; PT unable to fully assess test due to pt inability to relax into position.  TREATMENT DATE:  02/18/24 Quad sets x 15 Bridge x 15 SLR without ext lag x 10 Patella mobs grade 2 all planes; STW to medial hamstrings and adductors Sidelying L hip adduction x 20 without pain and with good quality control Sidelying L hip abduction x 15 Sidelying L hip abduction with knees flexed x 15 Sidelying L clam shell x 15 SAQ 3 lbs x 20 L Hamstring Digs x 15 with 2-3 sec hold Ball squeeze with bridge x 20 Hamstring stretch supine 2x30 sec Butterfly stretch 2x30 sec L knee to opposite shoulder 2x30 sec All performed to L LE. Monitored for pain throughout.   02/11/24 Grade I-II medical and lateral patellar mobs  STM to gastroc and soleus Quad sets 2x10  Active hamstring stretch x5  Calf stretch 3x30 seconds on 1/2 foam roller  Hip abduction in side lying 3x10 Glute bridges 3x10  02/04/24: Evaluation performed; Attempted quad set and SLR L but pt reports feeling pinch under inferior pole of patella; therefore, discontinued. Pt was noted to have extensive shaking of Quad in performing SLR compared to R. Pt was able to perform ball squeeze without pain; therefore, issued as HEP. Evaluation history took extra time due to usage of interpreter and understanding mechanism of injury.   PATIENT EDUCATION:  Education details: relevant anatomy and activity modification to decrease pain Person educated: Patient and interpreter Education method: Explanation, Demonstration, Tactile cues, Verbal cues, and Handouts Education comprehension: verbalized understanding and returned demonstration  HOME EXERCISE PROGRAM: Access Code: [email or web address] URL:  [email or web address] Date: 02/04/2024 Prepared by: Alger Ada  Exercises - Supine Hip Adduction Isometric with Ball  - 1 x daily - 7 x weekly - 1 sets - 20 reps   ASSESSMENT:  CLINICAL IMPRESSION: Patient tolerated exercises well with minimal pain this visit. No pain with hip adduction therex even though functional pain reported medial knee. Utilized manual therapy to decrease hyperactive musculature and decrease pain. Exercises focused on hip strengthening to improve activity tolerance. Reviewed HEP. Will continue to benefit from therapy to address remaining limitations and return to prior level of function. PT advised pt when he feels the pain at home to make a note what he was doing when he felt it. Interpreter present.  OBJECTIVE IMPAIRMENTS: Abnormal gait, decreased mobility, difficulty walking, decreased strength, hypomobility, and pain.   ACTIVITY LIMITATIONS: sitting, standing, squatting, stairs, and locomotion level  PARTICIPATION LIMITATIONS: community activity and overall mobility  PERSONAL FACTORS: Fitness are also affecting patient's functional outcome.   REHAB POTENTIAL: Good  CLINICAL DECISION MAKING: Stable/uncomplicated  EVALUATION COMPLEXITY: Low   GOALS: Goals reviewed with patient? Yes  SHORT TERM GOALS: Target date: 03/03/24  Pt will be independent with initial HEP to assist with pain management and L knee strengthening Baseline: Goal status: INITIAL  2.  Pt will be able to perform quad set and SLR without inferior patella pole L pinching to assist with quad strengthening Baseline:  Goal status:  INITIAL  3.  Pt will be able to squat with 25% less L knee pain to perform functional activities Baseline:  Goal status: INITIAL    LONG TERM GOALS: Target date: 04/28/24  Pt will be independent with advanced HEP to assist with strengthening and pain management L knee Baseline:  Goal status: INITIAL  2.  Pt will demonstrate improvement  in L knee extension strength >/=10 points to assist with functional activities Baseline:  Knee extension 28.4 19.5   Goal status: INITIAL  3.  Pt will be able to stand >/= 15 minutes without L knee pain to be able to perform functional activities Baseline:  Goal status: INITIAL  4.  Pt will be able to transfer sit to stand without L knee pain 75% of the time Baseline:  Goal status: INITIAL  5.  Pt will be able to sit >/=30 minutes with </=3/10 L knee pain to eat dinner Baseline:  Goal status: INITIAL  PLAN:  PT FREQUENCY: 1x/week  PT DURATION: 12 weeks  PLANNED INTERVENTIONS: 97164- PT Re-evaluation, 97110-Therapeutic exercises, 97530- Therapeutic activity, [id number]- Neuromuscular re-education, 97140- Manual therapy, [id number]- Gait training, 02964- Ultrasound, 02966- Ionotophoresis 4mg /ml Dexamethasone , Patient/Family education, Stair training, and Taping.  PLAN FOR NEXT SESSION:  try McConnell Taping as needed to lift L patella inferior pole prior to doing quad set and SLR, progress HEP, continue to work L hip adductors and glute med as well   Alger Ada, PT 02/18/2024, 9:29 AM

## 2024-02-25 ENCOUNTER — Ambulatory Visit (HOSPITAL_BASED_OUTPATIENT_CLINIC_OR_DEPARTMENT_OTHER): Attending: Physician Assistant | Admitting: Rehabilitative and Restorative Service Providers"

## 2024-02-25 ENCOUNTER — Encounter (HOSPITAL_BASED_OUTPATIENT_CLINIC_OR_DEPARTMENT_OTHER): Payer: Self-pay | Admitting: Rehabilitative and Restorative Service Providers"

## 2024-02-25 DIAGNOSIS — G8929 Other chronic pain: Secondary | ICD-10-CM | POA: Diagnosis present

## 2024-02-25 DIAGNOSIS — M25562 Pain in left knee: Secondary | ICD-10-CM | POA: Diagnosis present

## 2024-02-25 DIAGNOSIS — M6281 Muscle weakness (generalized): Secondary | ICD-10-CM | POA: Diagnosis present

## 2024-02-25 NOTE — Therapy (Signed)
 OUTPATIENT PHYSICAL THERAPY TREATMENT   Patient Name: Edward Klein MRN: [id number] DOB:June 13, 1975, 48 y.o., male Today's Date: 02/25/2024  END OF SESSION:   PT End of Session - 02/25/24 0847     Visit Number 4    Number of Visits 12    Date for Recertification  04/28/24    Authorization Type WC    PT Start Time 0848    PT Stop Time 0931    PT Time Calculation (min) 43 min    Activity Tolerance Patient tolerated treatment well;No increased pain    Behavior During Therapy [facility] for tasks assessed/performed             Past Medical History:  Diagnosis Date   Closed left ankle fracture 2006   Never repaired   Past Surgical History:  Procedure Laterality Date   INCISION AND DRAINAGE ABSCESS Left 09/08/2021   Procedure: Excision of left neck cyst;  Surgeon: Jesus Oliphant, MD;  Location: [facility] OR;  Service: ENT;  Laterality: Left;   KNEE ARTHROSCOPY WITH ANTERIOR CRUCIATE LIGAMENT (ACL) REPAIR Right 2010   Patient Active Problem List   Diagnosis Date Noted   Pain in left knee 12/28/2023   Mixed hyperlipidemia 07/28/2023   Class 2 severe obesity due to excess calories with serious comorbidity and body mass index (BMI) of 37.0 to 37.9 in adult 06/13/2023   Tobacco dependence 06/13/2023   Class 1 obesity 09/10/2021   Abscess, pharyngeal 09/08/2021   Right knee pain 05/14/2013   Left ankle pain 05/14/2013   Varicose veins 05/14/2013    REFERRING PROVIDER: Persons, Ronal Dragon, PA   REFERRING DIAG: [phone] (ICD-10-CM) - Acute pain of left knee   Rationale for Evaluation and Treatment: Rehabilitation  THERAPY DIAG:  Chronic pain of left knee  Muscle weakness (generalized)  ONSET DATE: 08/03/23   SUBJECTIVE:                                                                                                                                                                                           SUBJECTIVE STATEMENT: I have been lazy this week. Pain has been about a 4.    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   PAIN:  Are you having pain? Yes: NPRS scale: 4/10 Pain location: L knee (medial) Pain description: sharp Aggravating factors: standing and sitting Relieving factors: cold  PRECAUTIONS:  Other: 2010 R knee scope with ACL repair; don't lift heavy items more than 10-20 lbs per pt  report  RED FLAGS: None   WEIGHT BEARING RESTRICTIONS:  No  FALLS:  Has patient fallen in last 6 months? 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  DIAGNOSTIC FINDINGS:   08/10/23 Xray: No evidence of fracture, dislocation, or joint effusion. No evidence of arthropathy or other focal bone abnormality. Soft tissues are unremarkable.  09/10/23 MRI: Bones: There is no fracture or contusion pattern. There is minimal chondromalacia without accelerated arthrosis. No full-thickness cartilage defect is identified. No significant joint effusion is present. The extensor mechanism is intact. Loculated likely benign intra-articular ganglion cyst adjacent to the anterior joint line centrally. This is of uncertain clinical significance.   Ligaments: The ACL, PCL, MCL and fibular collateral ligament are intact.   Menisci: Lateral meniscus is unremarkable. Medial meniscus is unremarkable. Minimal mu  PATIENT SURVEYS:  LEFS  Extreme difficulty/unable (0), Quite a bit of difficulty (1), Moderate difficulty (2), Little difficulty (3), No difficulty (4) Survey date:    Any of your usual work, housework or school activities   2. Usual hobbies, recreational or sporting  activities   3. Getting into/out of the bath   4. Walking between rooms   5. Putting on socks/shoes   6. Squatting    7. Lifting an object, like a bag of groceries from the floor   8. Performing light activities around your home   9. Performing heavy activities around your home   10. Getting into/out of a car   11. Walking 2 blocks   12. Walking 1 mile   13. Going up/down 10 stairs (1 flight)   14. Standing for 1 hour   15.  sitting for 1 hour   16. Running on even ground   17. Running on uneven ground   18. Making sharp turns while running fast   19. Hopping    20. Rolling over in bed   Score total:  38     COGNITIVE STATUS: Within functional limits for tasks assessed   SENSATION: WFL  EDEMA:  Slight peripatella L  POSTURE:  ambulates with a wide base of support   GAIT: Distance walked: in gym Assistive device utilized: none Level of assistance: Complete Independence Comments: ambulates with a wide base of support  Body Part #1 Knee  PALPATION: Tender along medial L joint line; slight swelling noted peripatella; no TTP at patella tendon; patella mobility good.  LOWER EXTREMITY ROM:     Active  Right eval Left eval  Hip flexion    Hip extension    Hip abduction    Hip adduction    Hip internal rotation    Hip external rotation    Knee flexion 125 121  Knee extension 0 1  Ankle dorsiflexion    Ankle plantarflexion    Ankle inversion    Ankle eversion     (Blank rows = not tested)   LOWER EXTREMITY MMT:    MMT Right eval Left eval  Hip flexion 29.8 28  Hip extension    Hip abduction    Hip adduction (done in sitting) 18 20  Hip internal rotation    Hip external rotation    Knee flexion 26.5 25.7  Knee extension 28.4 19.5  Ankle dorsiflexion    Ankle plantarflexion  Ankle inversion    Ankle eversion     (Blank rows = not tested)    SPECIAL TESTS:  Lower Extremity Knee special tests: Anterior drawer test: negative, Posterior drawer  test: negative, Lachman Test: negative, McMurray's test: negative, and Pivot shift test: negative. SLR - for extension lag but noticeable tremors with all phases. Pt had a difficulty time relaxing during tests but did not report any of the tests to cause pain; PT unable to fully assess test due to pt inability to relax into position.  TREATMENT DATE:  02/25/24 NuStep level 6 x 5 min  6 step ups frontal and lateral x 20 each Airex: R hip abduction x 20, R Hamstring curl x 20, R hip extension x 20 to work on L LE muscle contraction Airex: L hamstring curl x 20, L hip abduction x 25, L hip abduct/ext combo x 20 Airex: DTR x 20, DHR x 20 Squats at countertop x 20 Standing hip flexor stretch 2x30 sec each side Seated hamstring stretch x 30 sec bil  02/18/24 Quad sets x 15 Bridge x 15 SLR without ext lag x 10 Patella mobs grade 2 all planes; STW to medial hamstrings and adductors Sidelying L hip adduction x 20 without pain and with good quality control Sidelying L hip abduction x 15 Sidelying L hip abduction with knees flexed x 15 Sidelying L clam shell x 15 SAQ 3 lbs x 20 L Hamstring Digs x 15 with 2-3 sec hold Ball squeeze with bridge x 20 Hamstring stretch supine 2x30 sec Butterfly stretch 2x30 sec L knee to opposite shoulder 2x30 sec All performed to L LE. Monitored for pain throughout.   02/11/24 Grade I-II medical and lateral patellar mobs  STM to gastroc and soleus Quad sets 2x10  Active hamstring stretch x5  Calf stretch 3x30 seconds on 1/2 foam roller  Hip abduction in side lying 3x10 Glute bridges 3x10  02/04/24: Evaluation performed; Attempted quad set and SLR L but pt reports feeling pinch under inferior pole of patella; therefore, discontinued. Pt was noted to have extensive shaking of Quad in performing SLR compared to R. Pt was able to perform ball squeeze without pain; therefore, issued as HEP. Evaluation history took extra time due to usage of interpreter and  understanding mechanism of injury.   PATIENT EDUCATION:  Education details: relevant anatomy and activity modification to decrease pain Person educated: Patient and interpreter Education method: Explanation, Demonstration, Tactile cues, Verbal cues, and Handouts Education comprehension: verbalized understanding and returned demonstration  HOME EXERCISE PROGRAM: Access Code: [email or web address] URL: [email or web address] Date: 02/04/2024 Prepared by: Alger Ada  Exercises - Supine Hip Adduction Isometric with Ball  - 1 x daily - 7 x weekly - 1 sets - 20 reps   ASSESSMENT:  CLINICAL IMPRESSION: Patient tolerated exercises well with minimal pain this visit.  Exercises focused on L hip strengthening to improve activity tolerance. Pt reports L SLS increased L knee pressure but able to perform without pain. Will continue to benefit from therapy to address remaining limitations and return to prior level of function. PT advised pt when he feels the pain at home to make a note what he was doing when he felt it. Interpreter present.  OBJECTIVE IMPAIRMENTS: Abnormal gait, decreased mobility, difficulty walking, decreased strength, hypomobility, and pain.   ACTIVITY LIMITATIONS: sitting, standing, squatting, stairs, and locomotion level  PARTICIPATION LIMITATIONS: community activity and overall mobility  PERSONAL FACTORS: Fitness are also affecting patient's functional outcome.   REHAB POTENTIAL: Good  CLINICAL DECISION MAKING: Stable/uncomplicated  EVALUATION COMPLEXITY: Low   GOALS: Goals reviewed with patient? Yes  SHORT TERM GOALS: Target date: 03/03/24  Pt will be independent with initial HEP to assist with pain management and L knee strengthening Baseline: Goal status: INITIAL  2.  Pt will be able to perform quad set and SLR without inferior patella pole L pinching to assist with quad strengthening Baseline:  Goal status: INITIAL  3.  Pt will be able to squat with 25%  less L knee pain to perform functional activities Baseline:  Goal status: INITIAL    LONG TERM GOALS: Target date: 04/28/24  Pt will be independent with advanced HEP to assist with strengthening and pain management L knee Baseline:  Goal status: INITIAL  2.  Pt will demonstrate improvement in L knee extension strength >/=10 points to assist with functional activities Baseline:  Knee extension 28.4 19.5   Goal status: INITIAL  3.  Pt will be able to stand >/= 15 minutes without L knee pain to be able to perform functional activities Baseline:  Goal status: INITIAL  4.  Pt will be able to transfer sit to stand without L knee pain 75% of the time Baseline:  Goal status: INITIAL  5.  Pt will be able to sit >/=30 minutes with </=3/10 L knee pain to eat dinner Baseline:  Goal status: INITIAL  PLAN:  PT FREQUENCY: 1x/week  PT DURATION: 12 weeks  PLANNED INTERVENTIONS: 97164- PT Re-evaluation, 97110-Therapeutic exercises, 97530- Therapeutic activity, [id number]- Neuromuscular re-education, 97140- Manual therapy, [id number]- Gait training, 02964- Ultrasound, 02966- Ionotophoresis 4mg /ml Dexamethasone , Patient/Family education, Stair training, and Taping.  PLAN FOR NEXT SESSION:  try McConnell Taping as needed to lift L patella inferior pole prior to doing quad set and SLR, progress HEP, continue to work L hip adductors and glute med as well   Alger Ada, PT 02/25/2024, 9:32 AM

## 2024-03-01 NOTE — Therapy (Deleted)
 OUTPATIENT PHYSICAL THERAPY TREATMENT   Patient Name: Edward Klein MRN: [id number] DOB:[birth year], 48 y.o., male Today's Date: 03/01/2024  END OF SESSION:        Past Medical History:  Diagnosis Date   Closed left ankle fracture 2006   Never repaired   Past Surgical History:  Procedure Laterality Date   INCISION AND DRAINAGE ABSCESS Left 09/08/2021   Procedure: Excision of left neck cyst;  Surgeon: Jesus Oliphant, MD;  Location: [facility] OR;  Service: ENT;  Laterality: Left;   KNEE ARTHROSCOPY WITH ANTERIOR CRUCIATE LIGAMENT (ACL) REPAIR Right 2010   Patient Active Problem List   Diagnosis Date Noted   Pain in left knee 12/28/2023   Mixed hyperlipidemia 07/28/2023   Class 2 severe obesity due to excess calories with serious comorbidity and body mass index (BMI) of 37.0 to 37.9 in adult 06/13/2023   Tobacco dependence 06/13/2023   Class 1 obesity 09/10/2021   Abscess, pharyngeal 09/08/2021   Right knee pain 05/14/2013   Left ankle pain 05/14/2013   Varicose veins 05/14/2013    REFERRING PROVIDER: Persons, Ronal Dragon, PA   REFERRING DIAG: [phone] (ICD-10-CM) - Acute pain of left knee   Rationale for Evaluation and Treatment: Rehabilitation  THERAPY DIAG:  No diagnosis found.  ONSET DATE: 08/03/23   SUBJECTIVE:                                                                                                                                                                                           SUBJECTIVE STATEMENT: I have been lazy this week. Pain has been about a 4.   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   PAIN:  Are you having pain? Yes: NPRS scale: 4/10 Pain location: L knee (medial) Pain description: sharp Aggravating factors: standing and sitting Relieving factors: cold  PRECAUTIONS:  Other: 2010 R knee scope with  ACL repair; don't lift heavy items more than 10-20 lbs per pt report  RED FLAGS: None   WEIGHT BEARING RESTRICTIONS:  No  FALLS:  Has patient fallen in last 6 months? 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  DIAGNOSTIC FINDINGS:   08/10/23 Xray: No evidence of fracture, dislocation, or joint effusion. No evidence of arthropathy or  other focal bone abnormality. Soft tissues are unremarkable.  09/10/23 MRI: Bones: There is no fracture or contusion pattern. There is minimal chondromalacia without accelerated arthrosis. No full-thickness cartilage defect is identified. No significant joint effusion is present. The extensor mechanism is intact. Loculated likely benign intra-articular ganglion cyst adjacent to the anterior joint line centrally. This is of uncertain clinical significance.   Ligaments: The ACL, PCL, MCL and fibular collateral ligament are intact.   Menisci: Lateral meniscus is unremarkable. Medial meniscus is unremarkable. Minimal mu  PATIENT SURVEYS:  LEFS  Extreme difficulty/unable (0), Quite a bit of difficulty (1), Moderate difficulty (2), Little difficulty (3), No difficulty (4) Survey date:    Any of your usual work, housework or school activities   2. Usual hobbies, recreational or sporting activities   3. Getting into/out of the bath   4. Walking between rooms   5. Putting on socks/shoes   6. Squatting    7. Lifting an object, like a bag of groceries from the floor   8. Performing light activities around your home   9. Performing heavy activities around your home   10. Getting into/out of a car   11. Walking 2 blocks   12. Walking 1 mile   13. Going up/down 10 stairs (1 flight)   14. Standing for  1 hour   15.  sitting for 1 hour   16. Running on even ground   17. Running on uneven ground   18. Making sharp turns while running fast   19. Hopping    20. Rolling over in bed   Score total:  38     COGNITIVE STATUS: Within functional limits for tasks assessed   SENSATION: WFL  EDEMA:  Slight peripatella L  POSTURE:  ambulates with a wide base of support   GAIT: Distance walked: in gym Assistive device utilized: none Level of assistance: Complete Independence Comments: ambulates with a wide base of support  Body Part #1 Knee  PALPATION: Tender along medial L joint line; slight swelling noted peripatella; no TTP at patella tendon; patella mobility good.  LOWER EXTREMITY ROM:     Active  Right eval Left eval  Hip flexion    Hip extension    Hip abduction    Hip adduction    Hip internal rotation    Hip external rotation    Knee flexion 125 121  Knee extension 0 1  Ankle dorsiflexion    Ankle plantarflexion    Ankle inversion    Ankle eversion     (Blank rows = not tested)   LOWER EXTREMITY MMT:    MMT Right eval Left eval  Hip flexion 29.8 28  Hip extension    Hip abduction    Hip adduction (done in sitting) 18 20  Hip internal rotation    Hip external rotation    Knee flexion 26.5 25.7  Knee extension 28.4 19.5  Ankle dorsiflexion    Ankle plantarflexion    Ankle inversion    Ankle eversion     (Blank rows = not tested)    SPECIAL TESTS:  Lower Extremity Knee special tests: Anterior drawer test: negative, Posterior drawer test: negative, Lachman Test: negative, McMurray's test: negative, and Pivot shift test: negative. SLR - for extension lag but noticeable tremors with all phases. Pt had a difficulty time relaxing during tests but did not report any of the tests to cause pain; PT unable to fully assess test due to pt inability to relax into  position.  TREATMENT DATE:  02/25/24 NuStep level 6 x 5 min  6 step ups frontal and lateral x 20  each Airex: R hip abduction x 20, R Hamstring curl x 20, R hip extension x 20 to work on L LE muscle contraction Airex: L hamstring curl x 20, L hip abduction x 25, L hip abduct/ext combo x 20 Airex: DTR x 20, DHR x 20 Squats at countertop x 20 Standing hip flexor stretch 2x30 sec each side Seated hamstring stretch x 30 sec bil  02/18/24 Quad sets x 15 Bridge x 15 SLR without ext lag x 10 Patella mobs grade 2 all planes; STW to medial hamstrings and adductors Sidelying L hip adduction x 20 without pain and with good quality control Sidelying L hip abduction x 15 Sidelying L hip abduction with knees flexed x 15 Sidelying L clam shell x 15 SAQ 3 lbs x 20 L Hamstring Digs x 15 with 2-3 sec hold Ball squeeze with bridge x 20 Hamstring stretch supine 2x30 sec Butterfly stretch 2x30 sec L knee to opposite shoulder 2x30 sec All performed to L LE. Monitored for pain throughout.   02/11/24 Grade I-II medical and lateral patellar mobs  STM to gastroc and soleus Quad sets 2x10  Active hamstring stretch x5  Calf stretch 3x30 seconds on 1/2 foam roller  Hip abduction in side lying 3x10 Glute bridges 3x10  02/04/24: Evaluation performed; Attempted quad set and SLR L but pt reports feeling pinch under inferior pole of patella; therefore, discontinued. Pt was noted to have extensive shaking of Quad in performing SLR compared to R. Pt was able to perform ball squeeze without pain; therefore, issued as HEP. Evaluation history took extra time due to usage of interpreter and understanding mechanism of injury.   PATIENT EDUCATION:  Education details: relevant anatomy and activity modification to decrease pain Person educated: Patient and interpreter Education method: Explanation, Demonstration, Tactile cues, Verbal cues, and Handouts Education comprehension: verbalized understanding and returned demonstration  HOME EXERCISE PROGRAM: Access Code: [email or web address] URL:  [email or web address] Date: 02/04/2024 Prepared by: Alger Ada  Exercises - Supine Hip Adduction Isometric with Ball  - 1 x daily - 7 x weekly - 1 sets - 20 reps   ASSESSMENT:  CLINICAL IMPRESSION: Patient tolerated exercises well with minimal pain this visit.  Exercises focused on L hip strengthening to improve activity tolerance. Pt reports L SLS increased L knee pressure but able to perform without pain. Will continue to benefit from therapy to address remaining limitations and return to prior level of function. PT advised pt when he feels the pain at home to make a note what he was doing when he felt it. Interpreter present.  OBJECTIVE IMPAIRMENTS: Abnormal gait, decreased mobility, difficulty walking, decreased strength, hypomobility, and pain.   ACTIVITY LIMITATIONS: sitting, standing, squatting, stairs, and locomotion level  PARTICIPATION LIMITATIONS: community activity and overall mobility  PERSONAL FACTORS: Fitness are also affecting patient's functional outcome.   REHAB POTENTIAL: Good  CLINICAL DECISION MAKING: Stable/uncomplicated  EVALUATION COMPLEXITY: Low   GOALS: Goals reviewed with patient? Yes  SHORT TERM GOALS: Target date: 03/03/24  Pt will be independent with initial HEP to assist with pain management and L knee strengthening Baseline: Goal status: INITIAL  2.  Pt will be able to perform quad set and SLR without inferior patella pole L pinching to assist with quad strengthening Baseline:  Goal status: INITIAL  3.  Pt will be able to squat with 25% less L knee pain to  perform functional activities Baseline:  Goal status: INITIAL    LONG TERM GOALS: Target date: 04/28/24  Pt will be independent with advanced HEP to assist with strengthening and pain management L knee Baseline:  Goal status: INITIAL  2.  Pt will demonstrate improvement in L knee extension strength >/=10 points to assist with functional activities Baseline:  Knee  extension 28.4 19.5   Goal status: INITIAL  3.  Pt will be able to stand >/= 15 minutes without L knee pain to be able to perform functional activities Baseline:  Goal status: INITIAL  4.  Pt will be able to transfer sit to stand without L knee pain 75% of the time Baseline:  Goal status: INITIAL  5.  Pt will be able to sit >/=30 minutes with </=3/10 L knee pain to eat dinner Baseline:  Goal status: INITIAL  PLAN:  PT FREQUENCY: 1x/week  PT DURATION: 12 weeks  PLANNED INTERVENTIONS: 97164- PT Re-evaluation, 97110-Therapeutic exercises, 97530- Therapeutic activity, [id number]- Neuromuscular re-education, 97140- Manual therapy, [id number]- Gait training, 02964- Ultrasound, 02966- Ionotophoresis 4mg /ml Dexamethasone , Patient/Family education, Stair training, and Taping.  PLAN FOR NEXT SESSION:  try McConnell Taping as needed to lift L patella inferior pole prior to doing quad set and SLR, progress HEP, continue to work L hip adductors and glute med as well   Rojean JONELLE Batten, PT 03/01/2024, 2:21 PM

## 2024-03-03 ENCOUNTER — Ambulatory Visit (HOSPITAL_BASED_OUTPATIENT_CLINIC_OR_DEPARTMENT_OTHER): Attending: Physician Assistant | Admitting: Physical Therapy

## 2024-03-03 ENCOUNTER — Telehealth (HOSPITAL_BASED_OUTPATIENT_CLINIC_OR_DEPARTMENT_OTHER): Payer: Self-pay | Admitting: Physical Therapy

## 2024-03-03 DIAGNOSIS — M6281 Muscle weakness (generalized): Secondary | ICD-10-CM | POA: Insufficient documentation

## 2024-03-03 DIAGNOSIS — G8929 Other chronic pain: Secondary | ICD-10-CM | POA: Insufficient documentation

## 2024-03-03 DIAGNOSIS — M25562 Pain in left knee: Secondary | ICD-10-CM | POA: Insufficient documentation

## 2024-03-03 NOTE — Telephone Encounter (Signed)
 LVM for pt with missed appt time, and next scheduled appt.  Per attendance policy: If patient has 2 occasions of late cancellation (less than 24 hr notice) or no-show, patient will be allowed to schedule one appointment at a time.  After the 3rd incident of late cancellation or no-show, patient will be discharged and require a new referral from physician to resume treatment.

## 2024-03-05 ENCOUNTER — Encounter: Payer: Self-pay | Admitting: Radiology

## 2024-03-10 ENCOUNTER — Ambulatory Visit (HOSPITAL_BASED_OUTPATIENT_CLINIC_OR_DEPARTMENT_OTHER): Admitting: Physical Therapy

## 2024-03-10 DIAGNOSIS — M6281 Muscle weakness (generalized): Secondary | ICD-10-CM | POA: Diagnosis present

## 2024-03-10 DIAGNOSIS — M25562 Pain in left knee: Secondary | ICD-10-CM | POA: Diagnosis present

## 2024-03-10 DIAGNOSIS — G8929 Other chronic pain: Secondary | ICD-10-CM | POA: Diagnosis present

## 2024-03-10 NOTE — Therapy (Signed)
 OUTPATIENT PHYSICAL THERAPY TREATMENT   Patient Name: Edward Klein MRN: [id number] DOB:[birth year], 48 y.o., male Today's Date: 03/10/2024  END OF SESSION:   PT End of Session - 03/10/24 1004     Visit Number 5    Number of Visits 12    Date for Recertification  04/28/24    Authorization Type WC    PT Start Time 1000    PT Stop Time 1040    PT Time Calculation (min) 40 min    Activity Tolerance Patient tolerated treatment well    Behavior During Therapy [facility] for tasks assessed/performed              Past Medical History:  Diagnosis Date   Closed left ankle fracture 2006   Never repaired   Past Surgical History:  Procedure Laterality Date   INCISION AND DRAINAGE ABSCESS Left 09/08/2021   Procedure: Excision of left neck cyst;  Surgeon: Jesus Oliphant, MD;  Location: [facility] OR;  Service: ENT;  Laterality: Left;   KNEE ARTHROSCOPY WITH ANTERIOR CRUCIATE LIGAMENT (ACL) REPAIR Right 2010   Patient Active Problem List   Diagnosis Date Noted   Pain in left knee 12/28/2023   Mixed hyperlipidemia 07/28/2023   Class 2 severe obesity due to excess calories with serious comorbidity and body mass index (BMI) of 37.0 to 37.9 in adult 06/13/2023   Tobacco dependence 06/13/2023   Class 1 obesity 09/10/2021   Abscess, pharyngeal 09/08/2021   Right knee pain 05/14/2013   Left ankle pain 05/14/2013   Varicose veins 05/14/2013    REFERRING PROVIDER: Persons, Ronal Dragon, PA   REFERRING DIAG: [phone] (ICD-10-CM) - Acute pain of left knee   Rationale for Evaluation and Treatment: Rehabilitation  THERAPY DIAG:  Chronic pain of left knee  Muscle weakness (generalized)  ONSET DATE: 08/03/23   SUBJECTIVE:                                                                                                                                                                                           SUBJECTIVE STATEMENT: I am doing okay, nothing to complain about.   PERTINENT HISTORY:  Injured  at job. 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   PAIN:  Are you having pain? Yes: NPRS scale: 4/10 Pain location: L knee (medial) Pain description: sharp Aggravating factors: standing and sitting Relieving factors: cold  PRECAUTIONS:  Other: 2010 R knee scope with ACL repair; don't lift heavy items more than 10-20 lbs per pt report  RED FLAGS: None  WEIGHT BEARING RESTRICTIONS:  No  FALLS:  Has patient fallen in last 6 months? 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  DIAGNOSTIC FINDINGS:   08/10/23 Xray: No evidence of fracture, dislocation, or joint effusion. No evidence of arthropathy or other focal bone abnormality. Soft tissues are unremarkable.  09/10/23 MRI: Bones: There is no fracture or contusion pattern. There is minimal chondromalacia without accelerated arthrosis. No full-thickness cartilage defect is identified. No significant joint effusion is present. The extensor mechanism is intact. Loculated likely benign intra-articular ganglion cyst adjacent to the anterior joint line centrally. This is of uncertain clinical significance.   Ligaments: The ACL, PCL, MCL and fibular collateral ligament are intact.   Menisci: Lateral meniscus is unremarkable. Medial meniscus is unremarkable. Minimal mu  PATIENT SURVEYS:  LEFS  Extreme difficulty/unable (0), Quite a bit of difficulty (1), Moderate difficulty (2), Little difficulty (3), No difficulty (4) Survey date:    Any of your usual work, housework or school activities   2. Usual hobbies, recreational or sporting activities   3. Getting into/out of the  bath   4. Walking between rooms   5. Putting on socks/shoes   6. Squatting    7. Lifting an object, like a bag of groceries from the floor   8. Performing light activities around your home   9. Performing heavy activities around your home   10. Getting into/out of a car   11. Walking 2 blocks   12. Walking 1 mile   13. Going up/down 10 stairs (1 flight)   14. Standing for 1 hour   15.  sitting for 1 hour   16. Running on even ground   17. Running on uneven ground   18. Making sharp turns while running fast   19. Hopping    20. Rolling over in bed   Score total:  38     COGNITIVE STATUS: Within functional limits for tasks assessed   SENSATION: WFL  EDEMA:  Slight peripatella L  POSTURE:  ambulates with a wide base of support   GAIT: Distance walked: in gym Assistive device utilized: none Level of assistance: Complete Independence Comments: ambulates with a wide base of support  Body Part #1 Knee  PALPATION: Tender along medial L joint line; slight swelling noted peripatella; no TTP at patella tendon; patella mobility good.  LOWER EXTREMITY ROM:     Active  Right eval Left eval  Hip flexion    Hip extension    Hip abduction    Hip adduction    Hip internal rotation    Hip external rotation    Knee flexion 125 121  Knee extension 0 1  Ankle dorsiflexion    Ankle plantarflexion    Ankle inversion    Ankle eversion     (Blank rows = not tested)   LOWER EXTREMITY MMT:    MMT Right eval Left eval  Hip flexion 29.8 28  Hip extension    Hip abduction    Hip adduction (done in sitting) 18 20  Hip internal rotation    Hip external rotation    Knee flexion 26.5 25.7  Knee extension 28.4 19.5  Ankle dorsiflexion    Ankle plantarflexion    Ankle inversion  Ankle eversion     (Blank rows = not tested)    SPECIAL TESTS:  Lower Extremity Knee special tests: Anterior drawer test: negative, Posterior drawer test: negative, Lachman Test: negative,  McMurray's test: negative, and Pivot shift test: negative. SLR - for extension lag but noticeable tremors with all phases. Pt had a difficulty time relaxing during tests but did not report any of the tests to cause pain; PT unable to fully assess test due to pt inability to relax into position.  TREATMENT DATE:  03/10/24 Sci Fit lvl 1, 5 min 8 step ups frontal and lateral x 20 each Airex: R hip abduction x 20, R Hamstring curl x 20, R hip extension x 20 to work on L LE muscle contraction Squats at [employer] on airex pad x 20 Seated hamstring stretch x 30 sec bil Discussion about icing his knee due to patella irritation.   02/25/24 NuStep level 6 x 5 min  6 step ups frontal and lateral x 20 each Airex: R hip abduction x 20, R Hamstring curl x 20, R hip extension x 20 to work on L LE muscle contraction Airex: L hamstring curl x 20, L hip abduction x 25, L hip abduct/ext combo x 20 Airex: DTR x 20, DHR x 20 Squats at countertop x 20 Standing hip flexor stretch 2x30 sec each side Seated hamstring stretch x 30 sec bil  02/18/24 Quad sets x 15 Bridge x 15 SLR without ext lag x 10 Patella mobs grade 2 all planes; STW to medial hamstrings and adductors Sidelying L hip adduction x 20 without pain and with good quality control Sidelying L hip abduction x 15 Sidelying L hip abduction with knees flexed x 15 Sidelying L clam shell x 15 SAQ 3 lbs x 20 L Hamstring Digs x 15 with 2-3 sec hold Ball squeeze with bridge x 20 Hamstring stretch supine 2x30 sec Butterfly stretch 2x30 sec L knee to opposite shoulder 2x30 sec All performed to L LE. Monitored for pain throughout.   02/11/24 Grade I-II medical and lateral patellar mobs  STM to gastroc and soleus Quad sets 2x10  Active hamstring stretch x5  Calf stretch 3x30 seconds on 1/2 foam roller  Hip abduction in side lying 3x10 Glute bridges 3x10  02/04/24: Evaluation performed; Attempted quad set and SLR L but pt reports feeling pinch  under inferior pole of patella; therefore, discontinued. Pt was noted to have extensive shaking of Quad in performing SLR compared to R. Pt was able to perform ball squeeze without pain; therefore, issued as HEP. Evaluation history took extra time due to usage of interpreter and understanding mechanism of injury.   PATIENT EDUCATION:  Education details: relevant anatomy and activity modification to decrease pain Person educated: Patient and interpreter Education method: Explanation, Demonstration, Tactile cues, Verbal cues, and Handouts Education comprehension: verbalized understanding and returned demonstration  HOME EXERCISE PROGRAM: Access Code: [email or web address] URL: [email or web address] Date: 02/04/2024 Prepared by: Alger Ada  Exercises - Supine Hip Adduction Isometric with Ball  - 1 x daily - 7 x weekly - 1 sets - 20 reps   ASSESSMENT:  CLINICAL IMPRESSION: Patient tolerated exercises well with minimal pain this visit.  Exercises focused on L hip strengthening to improve activity tolerance. 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 Will continue to benefit from therapy to address remaining limitations and return to prior level of function. PT advised pt when he feels the pain at home to make a  note what he was doing when he felt it. Interpreter present.  OBJECTIVE IMPAIRMENTS: Abnormal gait, decreased mobility, difficulty walking, decreased strength, hypomobility, and pain.   ACTIVITY LIMITATIONS: sitting, standing, squatting, stairs, and locomotion level  PARTICIPATION LIMITATIONS: community activity and overall mobility  PERSONAL FACTORS: Fitness are also affecting patient's functional outcome.   REHAB POTENTIAL: Good  CLINICAL DECISION MAKING: Stable/uncomplicated  EVALUATION COMPLEXITY: Low   GOALS: Goals reviewed with patient? Yes  SHORT TERM GOALS: Target date: 03/03/24  Pt will be independent with  initial HEP to assist with pain management and L knee strengthening Baseline: Goal status: INITIAL  2.  Pt will be able to perform quad set and SLR without inferior patella pole L pinching to assist with quad strengthening Baseline:  Goal status: INITIAL  3.  Pt will be able to squat with 25% less L knee pain to perform functional activities Baseline:  Goal status: INITIAL    LONG TERM GOALS: Target date: 04/28/24  Pt will be independent with advanced HEP to assist with strengthening and pain management L knee Baseline:  Goal status: INITIAL  2.  Pt will demonstrate improvement in L knee extension strength >/=10 points to assist with functional activities Baseline:  Knee extension 28.4 19.5   Goal status: INITIAL  3.  Pt will be able to stand >/= 15 minutes without L knee pain to be able to perform functional activities Baseline:  Goal status: INITIAL  4.  Pt will be able to transfer sit to stand without L knee pain 75% of the time Baseline:  Goal status: INITIAL  5.  Pt will be able to sit >/=30 minutes with </=3/10 L knee pain to eat dinner Baseline:  Goal status: INITIAL  PLAN:  PT FREQUENCY: 1x/week  PT DURATION: 12 weeks  PLANNED INTERVENTIONS: 97164- PT Re-evaluation, 97110-Therapeutic exercises, 97530- Therapeutic activity, [id number]- Neuromuscular re-education, 97140- Manual therapy, [id number]- Gait training, 02964- Ultrasound, 02966- Ionotophoresis 4mg /ml Dexamethasone , Patient/Family education, Stair training, and Taping.  PLAN FOR NEXT SESSION:  try McConnell Taping as needed to lift L patella inferior pole prior to doing quad set and SLR, progress HEP, continue to work L hip adductors and glute med as well   Rojean JONELLE Batten, PT 03/10/2024, 10:49 AM

## 2024-03-17 ENCOUNTER — Ambulatory Visit (HOSPITAL_BASED_OUTPATIENT_CLINIC_OR_DEPARTMENT_OTHER): Admitting: Rehabilitative and Restorative Service Providers"

## 2024-03-17 ENCOUNTER — Encounter (HOSPITAL_BASED_OUTPATIENT_CLINIC_OR_DEPARTMENT_OTHER): Payer: Self-pay | Admitting: Rehabilitative and Restorative Service Providers"

## 2024-03-17 DIAGNOSIS — G8929 Other chronic pain: Secondary | ICD-10-CM

## 2024-03-17 DIAGNOSIS — M6281 Muscle weakness (generalized): Secondary | ICD-10-CM

## 2024-03-17 DIAGNOSIS — M25562 Pain in left knee: Secondary | ICD-10-CM | POA: Diagnosis not present

## 2024-03-17 NOTE — Therapy (Signed)
 OUTPATIENT PHYSICAL THERAPY TREATMENT   Patient Name: Edward Klein MRN: [id number] DOB:May 30, 1975, 48 y.o., male Today's Date: 03/17/2024  END OF SESSION:   PT End of Session - 03/17/24 0844     Visit Number 6    Number of Visits 12    Date for Recertification  04/28/24    Authorization Type WC    PT Start Time 0839    PT Stop Time 0928    PT Time Calculation (min) 49 min    Activity Tolerance Patient tolerated treatment well;No increased pain    Behavior During Therapy [facility] for tasks assessed/performed               Past Medical History:  Diagnosis Date   Closed left ankle fracture 2006   Never repaired   Past Surgical History:  Procedure Laterality Date   INCISION AND DRAINAGE ABSCESS Left 09/08/2021   Procedure: Excision of left neck cyst;  Surgeon: Jesus Oliphant, MD;  Location: [facility] OR;  Service: ENT;  Laterality: Left;   KNEE ARTHROSCOPY WITH ANTERIOR CRUCIATE LIGAMENT (ACL) REPAIR Right 2010   Patient Active Problem List   Diagnosis Date Noted   Pain in left knee 12/28/2023   Mixed hyperlipidemia 07/28/2023   Class 2 severe obesity due to excess calories with serious comorbidity and body mass index (BMI) of 37.0 to 37.9 in adult 06/13/2023   Tobacco dependence 06/13/2023   Class 1 obesity 09/10/2021   Abscess, pharyngeal 09/08/2021   Right knee pain 05/14/2013   Left ankle pain 05/14/2013   Varicose veins 05/14/2013    REFERRING PROVIDER: Persons, Ronal Dragon, PA   REFERRING DIAG: [phone] (ICD-10-CM) - Acute pain of left knee   Rationale for Evaluation and Treatment: Rehabilitation  THERAPY DIAG:  Chronic pain of left knee  Muscle weakness (generalized)  ONSET DATE: 08/03/23   SUBJECTIVE:                                                                                                                                                                                           SUBJECTIVE STATEMENT: Pain is still there but it is better. I still feel it  every morning and at the end of the day.   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   PAIN:  Are you having pain? Yes: NPRS scale: 3/10 Pain location: L knee (medial) Pain description: sharp Aggravating factors: standing and sitting Relieving factors: cold  PRECAUTIONS:  Other: 2010 R knee scope with ACL  repair; don't lift heavy items more than 10-20 lbs per pt report  RED FLAGS: None   WEIGHT BEARING RESTRICTIONS:  No  FALLS:  Has patient fallen in last 6 months? 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  DIAGNOSTIC FINDINGS:   08/10/23 Xray: No evidence of fracture, dislocation, or joint effusion. No evidence of arthropathy or other focal bone abnormality. Soft tissues are unremarkable.  09/10/23 MRI: Bones: There is no fracture or contusion pattern. There is minimal chondromalacia without accelerated arthrosis. No full-thickness cartilage defect is identified. No significant joint effusion is present. The extensor mechanism is intact. Loculated likely benign intra-articular ganglion cyst adjacent to the anterior joint line centrally. This is of uncertain clinical significance.   Ligaments: The ACL, PCL, MCL and fibular collateral ligament are intact.   Menisci: Lateral meniscus is unremarkable. Medial meniscus is unremarkable. Minimal mu  PATIENT SURVEYS:  LEFS  Extreme difficulty/unable (0), Quite a bit of difficulty (1), Moderate difficulty (2), Little difficulty (3), No difficulty (4) Survey date:    Any of your usual work, housework or school activities   2. Usual  hobbies, recreational or sporting activities   3. Getting into/out of the bath   4. Walking between rooms   5. Putting on socks/shoes   6. Squatting    7. Lifting an object, like a bag of groceries from the floor   8. Performing light activities around your home   9. Performing heavy activities around your home   10. Getting into/out of a car   11. Walking 2 blocks   12. Walking 1 mile   13. Going up/down 10 stairs (1 flight)   14. Standing for 1 hour   15.  sitting for 1 hour   16. Running on even ground   17. Running on uneven ground   18. Making sharp turns while running fast   19. Hopping    20. Rolling over in bed   Score total:  38     COGNITIVE STATUS: Within functional limits for tasks assessed   SENSATION: WFL  EDEMA:  Slight peripatella L  POSTURE:  ambulates with a wide base of support   GAIT: Distance walked: in gym Assistive device utilized: none Level of assistance: Complete Independence Comments: ambulates with a wide base of support  Body Part #1 Knee  PALPATION: Tender along medial L joint line; slight swelling noted peripatella; no TTP at patella tendon; patella mobility good.  LOWER EXTREMITY ROM:     Active  Right eval Left eval  Hip flexion    Hip extension    Hip abduction    Hip adduction    Hip internal rotation    Hip external rotation    Knee flexion 125 121  Knee extension 0 1  Ankle dorsiflexion    Ankle plantarflexion    Ankle inversion    Ankle eversion     (Blank rows = not tested)   LOWER EXTREMITY MMT:    MMT Right eval Left eval  Hip flexion 29.8 28  Hip extension    Hip abduction    Hip adduction (done in sitting) 18 20  Hip internal rotation    Hip external rotation    Knee flexion 26.5 25.7  Knee  extension 28.4 19.5  Ankle dorsiflexion    Ankle plantarflexion    Ankle inversion    Ankle eversion     (Blank rows = not tested)    SPECIAL TESTS:  Lower Extremity Knee special tests: Anterior drawer  test: negative, Posterior drawer test: negative, Lachman Test: negative, McMurray's test: negative, and Pivot shift test: negative. SLR - for extension lag but noticeable tremors with all phases. Pt had a difficulty time relaxing during tests but did not report any of the tests to cause pain; PT unable to fully assess test due to pt inability to relax into position.  TREATMENT DATE:  03/17/24 NuStep Les only level 6 x 5 min  Discussion with pt regarding needing to do his HEP consistently Airex pad with L LE: stepping over hurdle with RLE 2x20, squats 2x20 4 lb supine SLR x 20L SAQ 4 lbs x 20 L 4 lb sidelying L hip abduction x 20 4 lb sidelying L clam shell x 20 Sidelying L hip abduction circles into ER x 20 6 lateral step ups L x 20 Static lunges R foot forward x 20 at counter L prone quad stretch 3x30 sec Supine L Hamstring stretch 3x30 sec Supine L knee to opposite shoulder 2x30 sec  03/10/24 Sci Fit lvl 1, 5 min 8 step ups frontal and lateral x 20 each Airex: R hip abduction x 20, R Hamstring curl x 20, R hip extension x 20 to work on L LE muscle contraction Squats at countertop on airex pad x 20 Seated hamstring stretch x 30 sec bil Discussion about icing his knee due to patella irritation.   02/25/24 NuStep level 6 x 5 min  6 step ups frontal and lateral x 20 each Airex: R hip abduction x 20, R Hamstring curl x 20, R hip extension x 20 to work on L LE muscle contraction Airex: L hamstring curl x 20, L hip abduction x 25, L hip abduct/ext combo x 20 Airex: DTR x 20, DHR x 20 Squats at countertop x 20 Standing hip flexor stretch 2x30 sec each side Seated hamstring stretch x 30 sec bil  02/18/24 Quad sets x 15 Bridge x 15 SLR without ext lag x 10 Patella mobs grade 2 all planes; STW to medial hamstrings and adductors Sidelying L hip adduction x 20 without pain and with good quality control Sidelying L hip abduction x 15 Sidelying L hip abduction with knees flexed x  15 Sidelying L clam shell x 15 SAQ 3 lbs x 20 L Hamstring Digs x 15 with 2-3 sec hold Ball squeeze with bridge x 20 Hamstring stretch supine 2x30 sec Butterfly stretch 2x30 sec L knee to opposite shoulder 2x30 sec All performed to L LE. Monitored for pain throughout.   02/11/24 Grade I-II medical and lateral patellar mobs  STM to gastroc and soleus Quad sets 2x10  Active hamstring stretch x5  Calf stretch 3x30 seconds on 1/2 foam roller  Hip abduction in side lying 3x10 Glute bridges 3x10  02/04/24: Evaluation performed; Attempted quad set and SLR L but pt reports feeling pinch under inferior pole of patella; therefore, discontinued. Pt was noted to have extensive shaking of Quad in performing SLR compared to R. Pt was able to perform ball squeeze without pain; therefore, issued as HEP. Evaluation history took extra time due to usage of interpreter and understanding mechanism of injury.   PATIENT EDUCATION:  Education details: relevant anatomy and activity modification to decrease pain Person educated: Patient and interpreter  Education method: Explanation, Demonstration, Tactile cues, Verbal cues, and Handouts Education comprehension: verbalized understanding and returned demonstration  HOME EXERCISE PROGRAM: Access Code: [email or web address] URL: [email or web address] Date: 02/04/2024 Prepared by: Alger Ada  Exercises - Supine Hip Adduction Isometric with Ball  - 1 x daily - 7 x weekly - 1 sets - 20 reps   ASSESSMENT:  CLINICAL IMPRESSION: Patient tolerated exercises well with minimal pain this visit.  Exercises focused on L hip strengthening to improve activity tolerance.  Discussed importance of HEP compliance. Will continue to benefit from therapy to address remaining limitations and return to prior level of function. PT advised pt when he feels the pain at home to make a note what he was doing when he felt it. Interpreter present.  OBJECTIVE IMPAIRMENTS: Abnormal  gait, decreased mobility, difficulty walking, decreased strength, hypomobility, and pain.   ACTIVITY LIMITATIONS: sitting, standing, squatting, stairs, and locomotion level  PARTICIPATION LIMITATIONS: community activity and overall mobility  PERSONAL FACTORS: Fitness are also affecting patient's functional outcome.   REHAB POTENTIAL: Good  CLINICAL DECISION MAKING: Stable/uncomplicated  EVALUATION COMPLEXITY: Low   GOALS: Goals reviewed with patient? Yes  SHORT TERM GOALS: Target date: 03/03/24  Pt will be independent with initial HEP to assist with pain management and L knee strengthening Baseline: Goal status: INITIAL  2.  Pt will be able to perform quad set and SLR without inferior patella pole L pinching to assist with quad strengthening Baseline:  Goal status: INITIAL  3.  Pt will be able to squat with 25% less L knee pain to perform functional activities Baseline:  Goal status: INITIAL    LONG TERM GOALS: Target date: 04/28/24  Pt will be independent with advanced HEP to assist with strengthening and pain management L knee Baseline:  Goal status: INITIAL  2.  Pt will demonstrate improvement in L knee extension strength >/=10 points to assist with functional activities Baseline:  Knee extension 28.4 19.5   Goal status: INITIAL  3.  Pt will be able to stand >/= 15 minutes without L knee pain to be able to perform functional activities Baseline:  Goal status: INITIAL  4.  Pt will be able to transfer sit to stand without L knee pain 75% of the time Baseline:  Goal status: INITIAL  5.  Pt will be able to sit >/=30 minutes with </=3/10 L knee pain to eat dinner Baseline:  Goal status: INITIAL  PLAN:  PT FREQUENCY: 1x/week  PT DURATION: 12 weeks  PLANNED INTERVENTIONS: 97164- PT Re-evaluation, 97110-Therapeutic exercises, 97530- Therapeutic activity, [id number]- Neuromuscular re-education, 97140- Manual therapy, [id number]- Gait training, 02964- Ultrasound, 02966-  Ionotophoresis 4mg /ml Dexamethasone , Patient/Family education, Stair training, and Taping.  PLAN FOR NEXT SESSION:  continue to work L hip/knee all planes and glute med as well   Alger Ada, PT 03/17/2024, 9:31 AM

## 2024-03-24 ENCOUNTER — Encounter (HOSPITAL_BASED_OUTPATIENT_CLINIC_OR_DEPARTMENT_OTHER): Payer: Self-pay | Admitting: Rehabilitative and Restorative Service Providers"

## 2024-03-24 ENCOUNTER — Ambulatory Visit (HOSPITAL_BASED_OUTPATIENT_CLINIC_OR_DEPARTMENT_OTHER): Admitting: Rehabilitative and Restorative Service Providers"

## 2024-03-24 DIAGNOSIS — M6281 Muscle weakness (generalized): Secondary | ICD-10-CM

## 2024-03-24 DIAGNOSIS — M25562 Pain in left knee: Secondary | ICD-10-CM | POA: Diagnosis not present

## 2024-03-24 DIAGNOSIS — G8929 Other chronic pain: Secondary | ICD-10-CM

## 2024-03-24 NOTE — Therapy (Signed)
 OUTPATIENT PHYSICAL THERAPY TREATMENT   Patient Name: Edward Klein MRN: [id number] DOB:December 09, 1975, 48 y.o., male Today's Date: 03/24/2024  END OF SESSION:   PT End of Session - 03/24/24 0935     Visit Number 7    Number of Visits 12    Date for Recertification  04/28/24    Authorization Type WC    PT Start Time 0932    PT Stop Time 1019    PT Time Calculation (min) 47 min    Activity Tolerance Patient tolerated treatment well;No increased pain    Behavior During Therapy [facility] for tasks assessed/performed                Past Medical History:  Diagnosis Date   Closed left ankle fracture 2006   Never repaired   Past Surgical History:  Procedure Laterality Date   INCISION AND DRAINAGE ABSCESS Left 09/08/2021   Procedure: Excision of left neck cyst;  Surgeon: Jesus Oliphant, MD;  Location: [facility] OR;  Service: ENT;  Laterality: Left;   KNEE ARTHROSCOPY WITH ANTERIOR CRUCIATE LIGAMENT (ACL) REPAIR Right 2010   Patient Active Problem List   Diagnosis Date Noted   Pain in left knee 12/28/2023   Mixed hyperlipidemia 07/28/2023   Class 2 severe obesity due to excess calories with serious comorbidity and body mass index (BMI) of 37.0 to 37.9 in adult 06/13/2023   Tobacco dependence 06/13/2023   Class 1 obesity 09/10/2021   Abscess, pharyngeal 09/08/2021   Right knee pain 05/14/2013   Left ankle pain 05/14/2013   Varicose veins 05/14/2013    REFERRING PROVIDER: Persons, Ronal Dragon, PA   REFERRING DIAG: [phone] (ICD-10-CM) - Acute pain of left knee   Rationale for Evaluation and Treatment: Rehabilitation  THERAPY DIAG:  Chronic pain of left knee  Muscle weakness (generalized)  ONSET DATE: 08/03/23   SUBJECTIVE:                                                                                                                                                                                           SUBJECTIVE STATEMENT: Pain is still there but it is better. Still haven't  done exercises but I run around with my kids.   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   PAIN:  Are you having pain? Yes: NPRS scale: 3/10 Pain location: L knee (medial) Pain description: sharp Aggravating factors: standing and sitting Relieving factors: cold  PRECAUTIONS:  Other: 2010 R knee scope with ACL repair;  don't lift heavy items more than 10-20 lbs per pt report  RED FLAGS: None   WEIGHT BEARING RESTRICTIONS:  No  FALLS:  Has patient fallen in last 6 months? 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  DIAGNOSTIC FINDINGS:   08/10/23 Xray: No evidence of fracture, dislocation, or joint effusion. No evidence of arthropathy or other focal bone abnormality. Soft tissues are unremarkable.  09/10/23 MRI: Bones: There is no fracture or contusion pattern. There is minimal chondromalacia without accelerated arthrosis. No full-thickness cartilage defect is identified. No significant joint effusion is present. The extensor mechanism is intact. Loculated likely benign intra-articular ganglion cyst adjacent to the anterior joint line centrally. This is of uncertain clinical significance.   Ligaments: The ACL, PCL, MCL and fibular collateral ligament are intact.   Menisci: Lateral meniscus is unremarkable. Medial meniscus is unremarkable. Minimal mu  PATIENT SURVEYS:  LEFS  Extreme difficulty/unable (0), Quite a bit of difficulty (1), Moderate difficulty (2), Little difficulty (3), No difficulty (4) Survey date:    Any of your usual work, housework or school activities   2.  Usual hobbies, recreational or sporting activities   3. Getting into/out of the bath   4. Walking between rooms   5. Putting on socks/shoes   6. Squatting    7. Lifting an object, like a bag of groceries from the floor   8. Performing light activities around your home   9. Performing heavy activities around your home   10. Getting into/out of a car   11. Walking 2 blocks   12. Walking 1 mile   13. Going up/down 10 stairs (1 flight)   14. Standing for 1 hour   15.  sitting for 1 hour   16. Running on even ground   17. Running on uneven ground   18. Making sharp turns while running fast   19. Hopping    20. Rolling over in bed   Score total:  38     COGNITIVE STATUS: Within functional limits for tasks assessed   SENSATION: WFL  EDEMA:  Slight peripatella L  POSTURE:  ambulates with a wide base of support   GAIT: Distance walked: in gym Assistive device utilized: none Level of assistance: Complete Independence Comments: ambulates with a wide base of support  Body Part #1 Knee  PALPATION: Tender along medial L joint line; slight swelling noted peripatella; no TTP at patella tendon; patella mobility good.  LOWER EXTREMITY ROM:     Active  Right eval Left eval  Hip flexion    Hip extension    Hip abduction    Hip adduction    Hip internal rotation    Hip external rotation    Knee flexion 125 121  Knee extension 0 1  Ankle dorsiflexion    Ankle plantarflexion    Ankle inversion    Ankle eversion     (Blank rows = not tested)   LOWER EXTREMITY MMT:    MMT Right eval Left eval  Hip flexion 29.8 28  Hip extension    Hip abduction    Hip adduction (done in sitting) 18 20  Hip internal rotation    Hip external rotation    Knee flexion 26.5 25.7  Knee extension  28.4 19.5  Ankle dorsiflexion    Ankle plantarflexion    Ankle inversion    Ankle eversion     (Blank rows = not tested)    SPECIAL TESTS:  Lower Extremity Knee special tests: Anterior  drawer test: negative, Posterior drawer test: negative, Lachman Test: negative, McMurray's test: negative, and Pivot shift test: negative. SLR - for extension lag but noticeable tremors with all phases. Pt had a difficulty time relaxing during tests but did not report any of the tests to cause pain; PT unable to fully assess test due to pt inability to relax into position.  TREATMENT DATE:   03/24/24 NuStep level 5 x 5 min with PT discussion of need to do Hep Wall slides x 20 Prone L knee flexion x 20 5 lbs Prone L hip extension 3 lbs x 20 Prone L donkey kick unweighted x 20 Sidelying L clam shell x 25 Sidelying L hip abduction 4 lb x 25 Sidelying L hip circles CW/CCW x 20 each 4 lb SLR 4 lb x 25 L Piriformis stretch 2x30 sec Lower trunk rotation x 3 Supine L Hamstring stretch 2x30 sec  03/17/24 NuStep Les only level 6 x 5 min  Discussion with pt regarding needing to do his HEP consistently Airex pad with L LE: stepping over hurdle with RLE 2x20, squats 2x20 4 lb supine SLR x 20L SAQ 4 lbs x 20 L 4 lb sidelying L hip abduction x 20 4 lb sidelying L clam shell x 20 Sidelying L hip abduction circles into ER x 20 6 lateral step ups L x 20 Static lunges R foot forward x 20 at counter L prone quad stretch 3x30 sec Supine L Hamstring stretch 3x30 sec Supine L knee to opposite shoulder 2x30 sec  03/10/24 Sci Fit lvl 1, 5 min 8 step ups frontal and lateral x 20 each Airex: R hip abduction x 20, R Hamstring curl x 20, R hip extension x 20 to work on L LE muscle contraction Squats at countertop on airex pad x 20 Seated hamstring stretch x 30 sec bil Discussion about icing his knee due to patella irritation.   02/25/24 NuStep level 6 x 5 min  6 step ups frontal and lateral x 20 each Airex: R hip abduction x 20, R Hamstring curl x 20, R hip extension x 20 to work on L LE muscle contraction Airex: L hamstring curl x 20, L hip abduction x 25, L hip abduct/ext combo x 20 Airex: DTR  x 20, DHR x 20 Squats at countertop x 20 Standing hip flexor stretch 2x30 sec each side Seated hamstring stretch x 30 sec bil  02/18/24 Quad sets x 15 Bridge x 15 SLR without ext lag x 10 Patella mobs grade 2 all planes; STW to medial hamstrings and adductors Sidelying L hip adduction x 20 without pain and with good quality control Sidelying L hip abduction x 15 Sidelying L hip abduction with knees flexed x 15 Sidelying L clam shell x 15 SAQ 3 lbs x 20 L Hamstring Digs x 15 with 2-3 sec hold Ball squeeze with bridge x 20 Hamstring stretch supine 2x30 sec Butterfly stretch 2x30 sec L knee to opposite shoulder 2x30 sec All performed to L LE. Monitored for pain throughout.   02/11/24 Grade I-II medical and lateral patellar mobs  STM to gastroc and soleus Quad sets 2x10  Active hamstring stretch x5  Calf stretch 3x30 seconds on 1/2 foam roller  Hip abduction in side lying 3x10  Glute bridges 3x10  02/04/24: Evaluation performed; Attempted quad set and SLR L but pt reports feeling pinch under inferior pole of patella; therefore, discontinued. Pt was noted to have extensive shaking of Quad in performing SLR compared to R. Pt was able to perform ball squeeze without pain; therefore, issued as HEP. Evaluation history took extra time due to usage of interpreter and understanding mechanism of injury.   PATIENT EDUCATION:  Education details: relevant anatomy and activity modification to decrease pain Person educated: Patient and interpreter Education method: Explanation, Demonstration, Tactile cues, Verbal cues, and Handouts Education comprehension: verbalized understanding and returned demonstration  HOME EXERCISE PROGRAM: Access Code: [email or web address] URL: [email or web address] Date: 02/04/2024 Prepared by: Alger Ada  Exercises - Supine Hip Adduction Isometric with Ball  - 1 x daily - 7 x weekly - 1 sets - 20 reps   ASSESSMENT:  CLINICAL IMPRESSION: Patient tolerated  exercises well with minimal pain this visit.  Exercises focused on L hip strengthening to improve activity tolerance.  Discussed importance of HEP compliance. Will continue to benefit from therapy to address remaining limitations and return to prior level of function.   OBJECTIVE IMPAIRMENTS: Abnormal gait, decreased mobility, difficulty walking, decreased strength, hypomobility, and pain.   ACTIVITY LIMITATIONS: sitting, standing, squatting, stairs, and locomotion level  PARTICIPATION LIMITATIONS: community activity and overall mobility  PERSONAL FACTORS: Fitness are also affecting patient's functional outcome.   REHAB POTENTIAL: Good  CLINICAL DECISION MAKING: Stable/uncomplicated  EVALUATION COMPLEXITY: Low   GOALS: Goals reviewed with patient? Yes  SHORT TERM GOALS: Target date: 03/03/24  Pt will be independent with initial HEP to assist with pain management and L knee strengthening Baseline: Goal status: INITIAL  2.  Pt will be able to perform quad set and SLR without inferior patella pole L pinching to assist with quad strengthening Baseline:  Goal status: INITIAL  3.  Pt will be able to squat with 25% less L knee pain to perform functional activities Baseline:  Goal status: INITIAL    LONG TERM GOALS: Target date: 04/28/24  Pt will be independent with advanced HEP to assist with strengthening and pain management L knee Baseline:  Goal status: INITIAL  2.  Pt will demonstrate improvement in L knee extension strength >/=10 points to assist with functional activities Baseline:  Knee extension 28.4 19.5   Goal status: INITIAL  3.  Pt will be able to stand >/= 15 minutes without L knee pain to be able to perform functional activities Baseline:  Goal status: INITIAL  4.  Pt will be able to transfer sit to stand without L knee pain 75% of the time Baseline:  Goal status: INITIAL  5.  Pt will be able to sit >/=30 minutes with </=3/10 L knee pain to eat  dinner Baseline:  Goal status: INITIAL  PLAN:  PT FREQUENCY: 1x/week  PT DURATION: 12 weeks  PLANNED INTERVENTIONS: 97164- PT Re-evaluation, 97110-Therapeutic exercises, 97530- Therapeutic activity, [id number]- Neuromuscular re-education, 97140- Manual therapy, [id number]- Gait training, 02964- Ultrasound, 02966- Ionotophoresis 4mg /ml Dexamethasone , Patient/Family education, Stair training, and Taping.  PLAN FOR NEXT SESSION:  continue to work L hip/knee all planes and glute med as well   Alger Ada, PT 03/24/2024, 10:18 AM

## 2024-04-07 ENCOUNTER — Ambulatory Visit (HOSPITAL_BASED_OUTPATIENT_CLINIC_OR_DEPARTMENT_OTHER): Admitting: Physical Therapy

## 2024-04-07 ENCOUNTER — Encounter (HOSPITAL_BASED_OUTPATIENT_CLINIC_OR_DEPARTMENT_OTHER): Payer: Self-pay | Admitting: Physical Therapy

## 2024-04-07 DIAGNOSIS — M25562 Pain in left knee: Secondary | ICD-10-CM | POA: Insufficient documentation

## 2024-04-07 DIAGNOSIS — G8929 Other chronic pain: Secondary | ICD-10-CM | POA: Diagnosis present

## 2024-04-07 DIAGNOSIS — M6281 Muscle weakness (generalized): Secondary | ICD-10-CM | POA: Insufficient documentation

## 2024-04-07 NOTE — Therapy (Signed)
 OUTPATIENT PHYSICAL THERAPY TREATMENT   Patient Name: Woodward Klem MRN: [id number] DOB:1975/08/16, 48 y.o., male Today's Date: 04/07/2024  END OF SESSION:   PT End of Session - 04/07/24 0930     Visit Number 8    Number of Visits 12    Date for Recertification  04/28/24    Authorization Type WC    PT Start Time 0926    PT Stop Time 1000    PT Time Calculation (min) 34 min    Activity Tolerance Patient tolerated treatment well    Behavior During Therapy [facility] for tasks assessed/performed                 Past Medical History:  Diagnosis Date   Closed left ankle fracture 2006   Never repaired   Past Surgical History:  Procedure Laterality Date   INCISION AND DRAINAGE ABSCESS Left 09/08/2021   Procedure: Excision of left neck cyst;  Surgeon: Jesus Oliphant, MD;  Location: [facility] OR;  Service: ENT;  Laterality: Left;   KNEE ARTHROSCOPY WITH ANTERIOR CRUCIATE LIGAMENT (ACL) REPAIR Right 2010   Patient Active Problem List   Diagnosis Date Noted   Pain in left knee 12/28/2023   Mixed hyperlipidemia 07/28/2023   Class 2 severe obesity due to excess calories with serious comorbidity and body mass index (BMI) of 37.0 to 37.9 in adult 06/13/2023   Tobacco dependence 06/13/2023   Class 1 obesity 09/10/2021   Abscess, pharyngeal 09/08/2021   Right knee pain 05/14/2013   Left ankle pain 05/14/2013   Varicose veins 05/14/2013    REFERRING PROVIDER: Persons, Ronal Dragon, PA   REFERRING DIAG: [phone] (ICD-10-CM) - Acute pain of left knee   Rationale for Evaluation and Treatment: Rehabilitation  THERAPY DIAG:  Chronic pain of left knee  Muscle weakness (generalized)  ONSET DATE: 08/03/23   SUBJECTIVE:                                                                                                                                                                                           SUBJECTIVE STATEMENT: My wife wants to kill me because we just found out our last baby is  another boy. She wanted a girl. Otherwise all is good. I finish my CDL license in 2 weeks.   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   PAIN:  Are you having pain? Yes: NPRS scale: 3/10 Pain location: L knee (medial) Pain description: sharp Aggravating factors: standing and sitting Relieving  factors: cold  PRECAUTIONS:  Other: 2010 R knee scope with ACL repair; don't lift heavy items more than 10-20 lbs per pt report  RED FLAGS: None   WEIGHT BEARING RESTRICTIONS:  No  FALLS:  Has patient fallen in last 6 months? 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  DIAGNOSTIC FINDINGS:   08/10/23 Xray: No evidence of fracture, dislocation, or joint effusion. No evidence of arthropathy or other focal bone abnormality. Soft tissues are unremarkable.  09/10/23 MRI: Bones: There is no fracture or contusion pattern. There is minimal chondromalacia without accelerated arthrosis. No full-thickness cartilage defect is identified. No significant joint effusion is present. The extensor mechanism is intact. Loculated likely benign intra-articular ganglion cyst adjacent to the anterior joint line centrally. This is of uncertain clinical significance.   Ligaments: The ACL, PCL, MCL and fibular collateral ligament are intact.   Menisci: Lateral meniscus is unremarkable. Medial meniscus is unremarkable. Minimal mu  PATIENT SURVEYS:  LEFS  Extreme difficulty/unable (0), Quite a bit of difficulty (1), Moderate difficulty (2), Little difficulty (3), No difficulty (4) Survey date:    Any of your usual  work, housework or school activities   2. Usual hobbies, recreational or sporting activities   3. Getting into/out of the bath   4. Walking between rooms   5. Putting on socks/shoes   6. Squatting    7. Lifting an object, like a bag of groceries from the floor   8. Performing light activities around your home   9. Performing heavy activities around your home   10. Getting into/out of a car   11. Walking 2 blocks   12. Walking 1 mile   13. Going up/down 10 stairs (1 flight)   14. Standing for 1 hour   15.  sitting for 1 hour   16. Running on even ground   17. Running on uneven ground   18. Making sharp turns while running fast   19. Hopping    20. Rolling over in bed   Score total:  38     COGNITIVE STATUS: Within functional limits for tasks assessed   SENSATION: WFL  EDEMA:  Slight peripatella L  POSTURE:  ambulates with a wide base of support   GAIT: Distance walked: in gym Assistive device utilized: none Level of assistance: Complete Independence Comments: ambulates with a wide base of support  Body Part #1 Knee  PALPATION: Tender along medial L joint line; slight swelling noted peripatella; no TTP at patella tendon; patella mobility good.  LOWER EXTREMITY ROM:     Active  Right eval Left eval  Hip flexion    Hip extension    Hip abduction    Hip adduction    Hip internal rotation    Hip external rotation    Knee flexion 125 121  Knee extension 0 1  Ankle dorsiflexion    Ankle plantarflexion    Ankle inversion    Ankle eversion     (Blank rows = not tested)   LOWER EXTREMITY MMT:    MMT Right eval Left eval  Hip flexion 29.8 28  Hip extension    Hip abduction    Hip adduction (done in sitting) 18 20  Hip internal rotation  Hip external rotation    Knee flexion 26.5 25.7  Knee extension 28.4 19.5  Ankle dorsiflexion    Ankle plantarflexion    Ankle inversion    Ankle eversion     (Blank rows = not tested)    SPECIAL TESTS:   Lower Extremity Knee special tests: Anterior drawer test: negative, Posterior drawer test: negative, Lachman Test: negative, McMurray's test: negative, and Pivot shift test: negative. SLR - for extension lag but noticeable tremors with all phases. Pt had a difficulty time relaxing during tests but did not report any of the tests to cause pain; PT unable to fully assess test due to pt inability to relax into position.  TREATMENT DATE:  04/07/24 NuStep level 5 x 5 min with PT present for subjective.  Modified thomas stretch to L LE Wall slides x 20 Sit to stands with 15lb kettle bell Prone L knee flexion x 20 5 lbs Sidelying L hip circles CW/CCW x 20 each  Lower trunk rotation with ball under feet x 20 Sidelying L clam shell x 25 L Piriformis stretch 2x30 sec  03/24/24 NuStep level 5 x 5 min with PT discussion of need to do Hep Wall slides x 20 Prone L knee flexion x 20 5 lbs Prone L hip extension 3 lbs x 20 Prone L donkey kick unweighted x 20 Sidelying L clam shell x 25 Sidelying L hip abduction 4 lb x 25 Sidelying L hip circles CW/CCW x 20 each 4 lb SLR 4 lb x 25 L Piriformis stretch 2x30 sec Lower trunk rotation x 3 Supine L Hamstring stretch 2x30 sec  03/17/24 NuStep Les only level 6 x 5 min  Discussion with pt regarding needing to do his HEP consistently Airex pad with L LE: stepping over hurdle with RLE 2x20, squats 2x20 4 lb supine SLR x 20L SAQ 4 lbs x 20 L 4 lb sidelying L hip abduction x 20 4 lb sidelying L clam shell x 20 Sidelying L hip abduction circles into ER x 20 6 lateral step ups L x 20 Static lunges R foot forward x 20 at counter L prone quad stretch 3x30 sec Supine L Hamstring stretch 3x30 sec Supine L knee to opposite shoulder 2x30 sec  03/10/24 Sci Fit lvl 1, 5 min 8 step ups frontal and lateral x 20 each Airex: R hip abduction x 20, R Hamstring curl x 20, R hip extension x 20 to work on L LE muscle contraction Squats at countertop on airex pad x  20 Seated hamstring stretch x 30 sec bil Discussion about icing his knee due to patella irritation.   02/25/24 NuStep level 6 x 5 min  6 step ups frontal and lateral x 20 each Airex: R hip abduction x 20, R Hamstring curl x 20, R hip extension x 20 to work on L LE muscle contraction Airex: L hamstring curl x 20, L hip abduction x 25, L hip abduct/ext combo x 20 Airex: DTR x 20, DHR x 20 Squats at countertop x 20 Standing hip flexor stretch 2x30 sec each side Seated hamstring stretch x 30 sec bil  02/18/24 Quad sets x 15 Bridge x 15 SLR without ext lag x 10 Patella mobs grade 2 all planes; STW to medial hamstrings and adductors Sidelying L hip adduction x 20 without pain and with good quality control Sidelying L hip abduction x 15 Sidelying L hip abduction with knees flexed x 15 Sidelying L clam shell x 15 SAQ 3 lbs x 20  L Hamstring Digs x 15 with 2-3 sec hold Ball squeeze with bridge x 20 Hamstring stretch supine 2x30 sec Butterfly stretch 2x30 sec L knee to opposite shoulder 2x30 sec All performed to L LE. Monitored for pain throughout.    PATIENT EDUCATION:  Education details: relevant anatomy and activity modification to decrease pain Person educated: Patient and interpreter Education method: Explanation, Demonstration, Tactile cues, Verbal cues, and Handouts Education comprehension: verbalized understanding and returned demonstration  HOME EXERCISE PROGRAM: Access Code: [email or web address] URL: [email or web address] Date: 02/04/2024 Prepared by: Alger Ada  Exercises - Supine Hip Adduction Isometric with Ball  - 1 x daily - 7 x weekly - 1 sets - 20 reps   ASSESSMENT:  CLINICAL IMPRESSION: Patient tolerated exercises okay today with reports of increased pain and spasms in patella tendon and HS. He has a lot of tension present throughout his LE's. Encouraged pt to stretch more regularly.  Exercises focused on L hip strengthening to improve activity  tolerance. Will continue to benefit from therapy to address remaining limitations and return to prior level of function.   OBJECTIVE IMPAIRMENTS: Abnormal gait, decreased mobility, difficulty walking, decreased strength, hypomobility, and pain.   ACTIVITY LIMITATIONS: sitting, standing, squatting, stairs, and locomotion level  PARTICIPATION LIMITATIONS: community activity and overall mobility  PERSONAL FACTORS: Fitness are also affecting patient's functional outcome.   REHAB POTENTIAL: Good  CLINICAL DECISION MAKING: Stable/uncomplicated  EVALUATION COMPLEXITY: Low   GOALS: Goals reviewed with patient? Yes  SHORT TERM GOALS: Target date: 03/03/24  Pt will be independent with initial HEP to assist with pain management and L knee strengthening Baseline: Goal status: INITIAL  2.  Pt will be able to perform quad set and SLR without inferior patella pole L pinching to assist with quad strengthening Baseline:  Goal status: INITIAL  3.  Pt will be able to squat with 25% less L knee pain to perform functional activities Baseline:  Goal status: INITIAL    LONG TERM GOALS: Target date: 04/28/24  Pt will be independent with advanced HEP to assist with strengthening and pain management L knee Baseline:  Goal status: INITIAL  2.  Pt will demonstrate improvement in L knee extension strength >/=10 points to assist with functional activities Baseline:  Knee extension 28.4 19.5   Goal status: INITIAL  3.  Pt will be able to stand >/= 15 minutes without L knee pain to be able to perform functional activities Baseline:  Goal status: INITIAL  4.  Pt will be able to transfer sit to stand without L knee pain 75% of the time Baseline:  Goal status: INITIAL  5.  Pt will be able to sit >/=30 minutes with </=3/10 L knee pain to eat dinner Baseline:  Goal status: INITIAL  PLAN:  PT FREQUENCY: 1x/week  PT DURATION: 12 weeks  PLANNED INTERVENTIONS: 97164- PT Re-evaluation,  97110-Therapeutic exercises, 97530- Therapeutic activity, [id number]- Neuromuscular re-education, 97140- Manual therapy, [id number]- Gait training, 02964- Ultrasound, 02966- Ionotophoresis 4mg /ml Dexamethasone , Patient/Family education, Stair training, and Taping.  PLAN FOR NEXT SESSION:  continue to work L hip/knee all planes and glute med as well   Rojean JONELLE Batten, PT 04/07/2024, 10:04 AM

## 2024-04-14 ENCOUNTER — Encounter (HOSPITAL_BASED_OUTPATIENT_CLINIC_OR_DEPARTMENT_OTHER): Payer: Self-pay | Admitting: Rehabilitative and Restorative Service Providers"

## 2024-04-14 ENCOUNTER — Ambulatory Visit (HOSPITAL_BASED_OUTPATIENT_CLINIC_OR_DEPARTMENT_OTHER): Admitting: Rehabilitative and Restorative Service Providers"

## 2024-04-14 DIAGNOSIS — M6281 Muscle weakness (generalized): Secondary | ICD-10-CM | POA: Insufficient documentation

## 2024-04-14 DIAGNOSIS — G8929 Other chronic pain: Secondary | ICD-10-CM | POA: Diagnosis present

## 2024-04-14 DIAGNOSIS — M25562 Pain in left knee: Secondary | ICD-10-CM | POA: Insufficient documentation

## 2024-04-14 NOTE — Therapy (Signed)
 OUTPATIENT PHYSICAL THERAPY TREATMENT   Patient Name: Edward Klein MRN: [id number] DOB:[birth year], 48 y.o., male Today's Date: 04/14/2024  END OF SESSION:   PT End of Session - 04/14/24 0931     Visit Number 9    Number of Visits 12    Date for Recertification  04/28/24    Authorization Type WC    PT Start Time 0932    PT Stop Time 1021    PT Time Calculation (min) 49 min    Activity Tolerance Patient tolerated treatment well;No increased pain    Behavior During Therapy [facility] for tasks assessed/performed                  Past Medical History:  Diagnosis Date   Closed left ankle fracture 2006   Never repaired   Past Surgical History:  Procedure Laterality Date   INCISION AND DRAINAGE ABSCESS Left 09/08/2021   Procedure: Excision of left neck cyst;  Surgeon: Jesus Oliphant, MD;  Location: [facility] OR;  Service: ENT;  Laterality: Left;   KNEE ARTHROSCOPY WITH ANTERIOR CRUCIATE LIGAMENT (ACL) REPAIR Right 2010   Patient Active Problem List   Diagnosis Date Noted   Pain in left knee 12/28/2023   Mixed hyperlipidemia 07/28/2023   Class 2 severe obesity due to excess calories with serious comorbidity and body mass index (BMI) of 37.0 to 37.9 in adult 06/13/2023   Tobacco dependence 06/13/2023   Class 1 obesity 09/10/2021   Abscess, pharyngeal 09/08/2021   Right knee pain 05/14/2013   Left ankle pain 05/14/2013   Varicose veins 05/14/2013    REFERRING PROVIDER: Persons, Ronal Dragon, PA   REFERRING DIAG: [phone] (ICD-10-CM) - Acute pain of left knee   Rationale for Evaluation and Treatment: Rehabilitation  THERAPY DIAG:  Chronic pain of left knee  Muscle weakness (generalized)  ONSET DATE: 08/03/23   SUBJECTIVE:                                                                                                                                                                                           SUBJECTIVE STATEMENT: I am doing good. 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 I also feel it more because it is cold. I can move around though otherwise and it is kinda OK.  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   PAIN:  Are you having pain? Yes: NPRS scale: 0/10 Pain location: L knee (medial) Pain description:  sharp Aggravating factors: standing and sitting Relieving factors: cold  PRECAUTIONS:  Other: 2010 R knee scope with ACL repair; don't lift heavy items more than 10-20 lbs per pt report  RED FLAGS: None   WEIGHT BEARING RESTRICTIONS:  No  FALLS:  Has patient fallen in last 6 months? 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  DIAGNOSTIC FINDINGS:   08/10/23 Xray: No evidence of fracture, dislocation, or joint effusion. No evidence of arthropathy or other focal bone abnormality. Soft tissues are unremarkable.  09/10/23 MRI: Bones: There is no fracture or contusion pattern. There is minimal chondromalacia without accelerated arthrosis. No full-thickness cartilage defect is identified. No significant joint effusion is present. The extensor mechanism is intact. Loculated likely benign intra-articular ganglion cyst adjacent to the anterior joint line centrally. This is of uncertain clinical significance.   Ligaments: The ACL, PCL, MCL and fibular collateral ligament are intact.   Menisci: Lateral meniscus is unremarkable. Medial meniscus is unremarkable. Minimal mu  PATIENT SURVEYS:  LEFS  Extreme difficulty/unable (0), Quite a bit of difficulty (1), Moderate difficulty (2), Little difficulty (3), No difficulty (4) Survey  date:    Any of your usual work, housework or school activities   2. Usual hobbies, recreational or sporting activities   3. Getting into/out of the bath   4. Walking between rooms   5. Putting on socks/shoes   6. Squatting    7. Lifting an object, like a bag of groceries from the floor   8. Performing light activities around your home   9. Performing heavy activities around your home   10. Getting into/out of a car   11. Walking 2 blocks   12. Walking 1 mile   13. Going up/down 10 stairs (1 flight)   14. Standing for 1 hour   15.  sitting for 1 hour   16. Running on even ground   17. Running on uneven ground   18. Making sharp turns while running fast   19. Hopping    20. Rolling over in bed   Score total:  38     COGNITIVE STATUS: Within functional limits for tasks assessed   SENSATION: WFL  EDEMA:  Slight peripatella L  POSTURE:  ambulates with a wide base of support   GAIT: Distance walked: in gym Assistive device utilized: none Level of assistance: Complete Independence Comments: ambulates with a wide base of support  Body Part #1 Knee  PALPATION: Tender along medial L joint line; slight swelling noted peripatella; no TTP at patella tendon; patella mobility good.  LOWER EXTREMITY ROM:     Active  Right eval Left eval  Hip flexion    Hip extension    Hip abduction    Hip adduction    Hip internal rotation    Hip external rotation    Knee flexion 125 121  Knee extension 0 1  Ankle dorsiflexion    Ankle plantarflexion    Ankle inversion    Ankle eversion     (Blank rows = not tested)   LOWER EXTREMITY MMT:    MMT Right eval Left eval  Hip flexion 29.8 28  Hip extension    Hip abduction    Hip adduction (done in sitting) 18 20  Hip internal rotation    Hip external rotation    Knee flexion 26.5 25.7  Knee extension 28.4 19.5  Ankle dorsiflexion    Ankle plantarflexion    Ankle inversion    Ankle eversion     (Blank rows = not  tested)    SPECIAL TESTS:  Lower Extremity Knee special tests: Anterior drawer test: negative, Posterior drawer test: negative, Lachman Test: negative, McMurray's test: negative, and Pivot shift test: negative. SLR - for extension lag but noticeable tremors with all phases. Pt had a difficulty time relaxing during tests but did not report any of the tests to cause pain; PT unable to fully assess test due to pt inability to relax into position.  TREATMENT DATE:  04/14/24 -NuStep Les only level 7 x 7 min with PT obtaining objective and trying to figure out aggravating functional factors. -Squats at counter x 20 -Static lunges with LLE fwd 2x20 -Calf raise x 20 -Airex: R Hamstring curl x 20, R hip abduction x 20, R hip extension x 20 to increase L LE joint co-contraction. R Hamstring curl without UE A on counter; rest are with hand assist -6 lateral step downs R x 20; retro step downs R x 20 -Bridge x 20 with 2-3 sec hold with concentration on glute squeeze and hamstring contraction -unilat bridge x 20 L -ball squeeze x 10 with 5 sec hold -Supine L Hamstring stretch 2x30 sec; L  hip flexor stretch 2x30 sec; butterfly stretch bil 2x30 sec   04/07/24 NuStep level 5 x 5 min with PT present for subjective.  Modified thomas stretch to L LE Wall slides x 20 Sit to stands with 15lb kettle bell Prone L knee flexion x 20 5 lbs Sidelying L hip circles CW/CCW x 20 each  Lower trunk rotation with ball under feet x 20 Sidelying L clam shell x 25 L Piriformis stretch 2x30 sec  03/24/24 NuStep level 5 x 5 min with PT discussion of need to do Hep Wall slides x 20 Prone L knee flexion x 20 5 lbs Prone L hip extension 3 lbs x 20 Prone L donkey kick unweighted x 20 Sidelying L clam shell x 25 Sidelying L hip abduction 4 lb x 25 Sidelying L hip circles CW/CCW x 20 each 4 lb SLR 4 lb x 25 L Piriformis stretch 2x30 sec Lower trunk rotation x 3 Supine L Hamstring stretch 2x30  sec  03/17/24 NuStep Les only level 6 x 5 min  Discussion with pt regarding needing to do his HEP consistently Airex pad with L LE: stepping over hurdle with RLE 2x20, squats 2x20 4 lb supine SLR x 20L SAQ 4 lbs x 20 L 4 lb sidelying L hip abduction x 20 4 lb sidelying L clam shell x 20 Sidelying L hip abduction circles into ER x 20 6 lateral step ups L x 20 Static lunges R foot forward x 20 at counter L prone quad stretch 3x30 sec Supine L Hamstring stretch 3x30 sec Supine L knee to opposite shoulder 2x30 sec  03/10/24 Sci Fit lvl 1, 5 min 8 step ups frontal and lateral x 20 each Airex: R hip abduction x 20, R Hamstring curl x 20, R hip extension x 20 to work on L LE muscle contraction Squats at countertop on airex pad x 20 Seated hamstring stretch x 30 sec bil Discussion about icing his knee due to patella irritation.   02/25/24 NuStep level 6 x 5 min  6 step ups frontal  and lateral x 20 each Airex: R hip abduction x 20, R Hamstring curl x 20, R hip extension x 20 to work on L LE muscle contraction Airex: L hamstring curl x 20, L hip abduction x 25, L hip abduct/ext combo x 20 Airex: DTR x 20, DHR x 20 Squats at countertop x 20 Standing hip flexor stretch 2x30 sec each side Seated hamstring stretch x 30 sec bil  02/18/24 Quad sets x 15 Bridge x 15 SLR without ext lag x 10 Patella mobs grade 2 all planes; STW to medial hamstrings and adductors Sidelying L hip adduction x 20 without pain and with good quality control Sidelying L hip abduction x 15 Sidelying L hip abduction with knees flexed x 15 Sidelying L clam shell x 15 SAQ 3 lbs x 20 L Hamstring Digs x 15 with 2-3 sec hold Ball squeeze with bridge x 20 Hamstring stretch supine 2x30 sec Butterfly stretch 2x30 sec L knee to opposite shoulder 2x30 sec All performed to L LE. Monitored for pain throughout.    PATIENT EDUCATION:  Education details: relevant anatomy and activity modification to decrease  pain Person educated: Patient and interpreter Education method: Explanation, Demonstration, Tactile cues, Verbal cues, and Handouts Education comprehension: verbalized understanding and returned demonstration  HOME EXERCISE PROGRAM: Access Code: [email or web address] URL: [email or web address] Date: 02/04/2024 Prepared by: Alger Ada  Exercises - Supine Hip Adduction Isometric with Ball  - 1 x daily - 7 x weekly - 1 sets - 20 reps   ASSESSMENT:  CLINICAL IMPRESSION: Patient tolerated exercises today without pain but did experience cramping after unilateral bridge relieved by stretching. Advised pt he may be sore in different places tomorrow. He stated acknowledgement. Pt continues to report non-compliance with HEP even with PT discussion of benefits of HEP. DC next visit? Advised pt to get followup MD appt due to not having seen MD since initial injury.   OBJECTIVE IMPAIRMENTS: Abnormal gait, decreased mobility, difficulty walking, decreased strength, hypomobility, and pain.   ACTIVITY LIMITATIONS: sitting, standing, squatting, stairs, and locomotion level  PARTICIPATION LIMITATIONS: community activity and overall mobility  PERSONAL FACTORS: Fitness are also affecting patient's functional outcome.   REHAB POTENTIAL: Good  CLINICAL DECISION MAKING: Stable/uncomplicated  EVALUATION COMPLEXITY: Low   GOALS: Goals reviewed with patient? Yes  SHORT TERM GOALS: Target date: 03/03/24  Pt will be independent with initial HEP to assist with pain management and L knee strengthening Baseline: Goal status: INITIAL  2.  Pt will be able to perform quad set and SLR without inferior patella pole L pinching to assist with quad strengthening Baseline:  Goal status: INITIAL  3.  Pt will be able to squat with 25% less L knee pain to perform functional activities Baseline:  Goal status: INITIAL    LONG TERM GOALS: Target date: 04/28/24  Pt will be independent with advanced HEP to  assist with strengthening and pain management L knee Baseline:  Goal status: INITIAL  2.  Pt will demonstrate improvement in L knee extension strength >/=10 points to assist with functional activities Baseline:  Knee extension 28.4 19.5   Goal status: INITIAL  3.  Pt will be able to stand >/= 15 minutes without L knee pain to be able to perform functional activities Baseline:  Goal status: INITIAL  4.  Pt will be able to transfer sit to stand without L knee pain 75% of the time Baseline:  Goal status: INITIAL  5.  Pt will be able to sit >/=30 minutes with </=3/10  L knee pain to eat dinner Baseline:  Goal status: INITIAL  PLAN:  PT FREQUENCY: 1x/week  PT DURATION: 12 weeks  PLANNED INTERVENTIONS: 97164- PT Re-evaluation, 97110-Therapeutic exercises, 97530- Therapeutic activity, [id number]- Neuromuscular re-education, 97140- Manual therapy, [id number]- Gait training, 02964- Ultrasound, [id number]- Ionotophoresis 4mg /ml Dexamethasone , Patient/Family education, Stair training, and Taping.  PLAN FOR NEXT SESSION: D/C?   Alger Ada, PT 04/14/2024, 12:13 PM

## 2024-04-21 ENCOUNTER — Ambulatory Visit (HOSPITAL_BASED_OUTPATIENT_CLINIC_OR_DEPARTMENT_OTHER): Admitting: Physical Therapy

## 2024-04-21 ENCOUNTER — Telehealth (HOSPITAL_BASED_OUTPATIENT_CLINIC_OR_DEPARTMENT_OTHER): Payer: Self-pay | Admitting: Physical Therapy

## 2024-04-21 ENCOUNTER — Encounter (HOSPITAL_BASED_OUTPATIENT_CLINIC_OR_DEPARTMENT_OTHER): Payer: Self-pay | Admitting: Physical Therapy

## 2024-04-21 NOTE — Telephone Encounter (Signed)
 Missed appt. Unable to LVM.

## 2024-04-21 NOTE — Therapy (Deleted)
 " OUTPATIENT PHYSICAL THERAPY TREATMENT   Patient Name: Edward Klein MRN: [id number] DOB:12-Mar-1976, 48 y.o., male Today's Date: 04/21/2024  END OF SESSION:             Past Medical History:  Diagnosis Date   Closed left ankle fracture 2006   Never repaired   Past Surgical History:  Procedure Laterality Date   INCISION AND DRAINAGE ABSCESS Left 09/08/2021   Procedure: Excision of left neck cyst;  Surgeon: Jesus Oliphant, MD;  Location: [facility] OR;  Service: ENT;  Laterality: Left;   KNEE ARTHROSCOPY WITH ANTERIOR CRUCIATE LIGAMENT (ACL) REPAIR Right 2010   Patient Active Problem List   Diagnosis Date Noted   Pain in left knee 12/28/2023   Mixed hyperlipidemia 07/28/2023   Class 2 severe obesity due to excess calories with serious comorbidity and body mass index (BMI) of 37.0 to 37.9 in adult 06/13/2023   Tobacco dependence 06/13/2023   Class 1 obesity 09/10/2021   Abscess, pharyngeal 09/08/2021   Right knee pain 05/14/2013   Left ankle pain 05/14/2013   Varicose veins 05/14/2013    REFERRING PROVIDER: Persons, Ronal Dragon, PA   REFERRING DIAG: [phone] (ICD-10-CM) - Acute pain of left knee   Rationale for Evaluation and Treatment: Rehabilitation  THERAPY DIAG:  No diagnosis found.  ONSET DATE: 08/03/23   SUBJECTIVE:                                                                                                                                                                                           SUBJECTIVE STATEMENT: I am doing good. 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 I also feel it more because it is cold. I can move around though otherwise and it is kinda OK.  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   PAIN:  Are you having pain? Yes: NPRS scale: 0/10 Pain location: L knee (medial) Pain  description: sharp Aggravating factors: standing and sitting Relieving factors: cold  PRECAUTIONS:  Other: 2010 R knee scope with ACL repair; don't lift heavy items more than 10-20 lbs per pt report  RED FLAGS: None   WEIGHT BEARING RESTRICTIONS:  No  FALLS:  Has patient fallen in last 6 months? 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  DIAGNOSTIC FINDINGS:   08/10/23 Xray: No evidence of fracture, dislocation, or joint effusion. No evidence of arthropathy or other focal bone abnormality. Soft tissues are unremarkable.  09/10/23 MRI: Bones: There is no fracture or contusion pattern. There is minimal chondromalacia without accelerated arthrosis. No full-thickness cartilage defect is identified. No significant joint effusion is present. The extensor mechanism is intact. Loculated likely benign intra-articular ganglion cyst adjacent to the anterior joint line centrally. This is of uncertain clinical significance.   Ligaments: The ACL, PCL, MCL and fibular collateral ligament are intact.   Menisci: Lateral meniscus is unremarkable. Medial meniscus is unremarkable. Minimal mu  PATIENT SURVEYS:  LEFS  Extreme difficulty/unable (0), Quite a bit of difficulty (1), Moderate difficulty (2), Little difficulty (3), No difficulty (4) Survey date:    Any of your usual work, housework or school activities   2. Usual hobbies, recreational or sporting activities   3. Getting into/out of the bath   4. Walking between rooms   5. Putting on socks/shoes   6. Squatting    7. Lifting an object, like a bag of groceries from the floor   8. Performing light activities around your home   9. Performing heavy activities around your home    10. Getting into/out of a car   11. Walking 2 blocks   12. Walking 1 mile   13. Going up/down 10 stairs (1 flight)   14. Standing for 1 hour   15.  sitting for 1 hour   16. Running on even ground   17. Running on uneven ground   18. Making sharp turns while running fast   19. Hopping    20. Rolling over in bed   Score total:  38     COGNITIVE STATUS: Within functional limits for tasks assessed   SENSATION: WFL  EDEMA:  Slight peripatella L  POSTURE:  ambulates with a wide base of support   GAIT: Distance walked: in gym Assistive device utilized: none Level of assistance: Complete Independence Comments: ambulates with a wide base of support  Body Part #1 Knee  PALPATION: Tender along medial L joint line; slight swelling noted peripatella; no TTP at patella tendon; patella mobility good.  LOWER EXTREMITY ROM:     Active  Right eval Left eval  Hip flexion    Hip extension    Hip abduction    Hip adduction    Hip internal rotation    Hip external rotation    Knee flexion 125 121  Knee extension 0 1  Ankle dorsiflexion    Ankle plantarflexion    Ankle inversion    Ankle eversion     (Blank rows = not tested)   LOWER EXTREMITY MMT:    MMT Right eval Left eval  Hip flexion 29.8 28  Hip extension    Hip abduction    Hip adduction (done in sitting) 18 20  Hip internal rotation    Hip external rotation    Knee flexion 26.5 25.7  Knee extension 28.4 19.5  Ankle dorsiflexion    Ankle plantarflexion    Ankle inversion    Ankle eversion     (Blank rows = not tested)    SPECIAL TESTS:  Lower Extremity Knee special tests: Anterior drawer test: negative, Posterior drawer test: negative, Lachman Test: negative, McMurray's test: negative, and Pivot shift test: negative. SLR - for extension lag but noticeable tremors with all phases. Pt had  a difficulty time relaxing during tests but did not report any of the tests to cause pain; PT unable to fully assess  test due to pt inability to relax into position.  TREATMENT DATE:  04/14/24 -NuStep Les only level 7 x 7 min with PT obtaining objective and trying to figure out aggravating functional factors. -Squats at counter x 20 -Static lunges with LLE fwd 2x20 -Calf raise x 20 -Airex: R Hamstring curl x 20, R hip abduction x 20, R hip extension x 20 to increase L LE joint co-contraction. R Hamstring curl without UE A on counter; rest are with hand assist -6 lateral step downs R x 20; retro step downs R x 20 -Bridge x 20 with 2-3 sec hold with concentration on glute squeeze and hamstring contraction -unilat bridge x 20 L -ball squeeze x 10 with 5 sec hold -Supine L Hamstring stretch 2x30 sec; L  hip flexor stretch 2x30 sec; butterfly stretch bil 2x30 sec   04/07/24 NuStep level 5 x 5 min with PT present for subjective.  Modified thomas stretch to L LE Wall slides x 20 Sit to stands with 15lb kettle bell Prone L knee flexion x 20 5 lbs Sidelying L hip circles CW/CCW x 20 each  Lower trunk rotation with ball under feet x 20 Sidelying L clam shell x 25 L Piriformis stretch 2x30 sec  03/24/24 NuStep level 5 x 5 min with PT discussion of need to do Hep Wall slides x 20 Prone L knee flexion x 20 5 lbs Prone L hip extension 3 lbs x 20 Prone L donkey kick unweighted x 20 Sidelying L clam shell x 25 Sidelying L hip abduction 4 lb x 25 Sidelying L hip circles CW/CCW x 20 each 4 lb SLR 4 lb x 25 L Piriformis stretch 2x30 sec Lower trunk rotation x 3 Supine L Hamstring stretch 2x30 sec  03/17/24 NuStep Les only level 6 x 5 min  Discussion with pt regarding needing to do his HEP consistently Airex pad with L LE: stepping over hurdle with RLE 2x20, squats 2x20 4 lb supine SLR x 20L SAQ 4 lbs x 20 L 4 lb sidelying L hip abduction x 20 4 lb sidelying L clam shell x 20 Sidelying L hip abduction circles into ER x 20 6 lateral step ups L x 20 Static lunges R foot forward x 20 at counter L  prone quad stretch 3x30 sec Supine L Hamstring stretch 3x30 sec Supine L knee to opposite shoulder 2x30 sec  03/10/24 Sci Fit lvl 1, 5 min 8 step ups frontal and lateral x 20 each Airex: R hip abduction x 20, R Hamstring curl x 20, R hip extension x 20 to work on L LE muscle contraction Squats at countertop on airex pad x 20 Seated hamstring stretch x 30 sec bil Discussion about icing his knee due to patella irritation.   02/25/24 NuStep level 6 x 5 min  6 step ups frontal and lateral x 20 each Airex: R hip abduction x 20, R Hamstring curl x 20, R hip extension x 20 to work on L LE muscle contraction Airex: L hamstring curl x 20, L hip abduction x 25, L hip abduct/ext combo x 20 Airex: DTR x 20, DHR x 20 Squats at countertop x 20 Standing hip flexor stretch 2x30 sec each side Seated hamstring stretch x 30 sec bil  02/18/24 Quad sets x 15 Bridge x 15 SLR without ext lag x 10 Patella mobs grade 2  all planes; STW to medial hamstrings and adductors Sidelying L hip adduction x 20 without pain and with good quality control Sidelying L hip abduction x 15 Sidelying L hip abduction with knees flexed x 15 Sidelying L clam shell x 15 SAQ 3 lbs x 20 L Hamstring Digs x 15 with 2-3 sec hold Ball squeeze with bridge x 20 Hamstring stretch supine 2x30 sec Butterfly stretch 2x30 sec L knee to opposite shoulder 2x30 sec All performed to L LE. Monitored for pain throughout.    PATIENT EDUCATION:  Education details: relevant anatomy and activity modification to decrease pain Person educated: Patient and interpreter Education method: Explanation, Demonstration, Tactile cues, Verbal cues, and Handouts Education comprehension: verbalized understanding and returned demonstration  HOME EXERCISE PROGRAM: Access Code: [email or web address] URL: [email or web address] Date: 02/04/2024 Prepared by: Alger Ada  Exercises - Supine Hip Adduction Isometric with Ball  - 1 x daily - 7 x weekly -  1 sets - 20 reps   ASSESSMENT:  CLINICAL IMPRESSION: Patient tolerated exercises today without pain but did experience cramping after unilateral bridge relieved by stretching. Advised pt he may be sore in different places tomorrow. He stated acknowledgement. Pt continues to report non-compliance with HEP even with PT discussion of benefits of HEP. DC next visit? Advised pt to get followup MD appt due to not having seen MD since initial injury.   OBJECTIVE IMPAIRMENTS: Abnormal gait, decreased mobility, difficulty walking, decreased strength, hypomobility, and pain.   ACTIVITY LIMITATIONS: sitting, standing, squatting, stairs, and locomotion level  PARTICIPATION LIMITATIONS: community activity and overall mobility  PERSONAL FACTORS: Fitness are also affecting patient's functional outcome.   REHAB POTENTIAL: Good  CLINICAL DECISION MAKING: Stable/uncomplicated  EVALUATION COMPLEXITY: Low   GOALS: Goals reviewed with patient? Yes  SHORT TERM GOALS: Target date: 03/03/24  Pt will be independent with initial HEP to assist with pain management and L knee strengthening Baseline: Goal status: INITIAL  2.  Pt will be able to perform quad set and SLR without inferior patella pole L pinching to assist with quad strengthening Baseline:  Goal status: INITIAL  3.  Pt will be able to squat with 25% less L knee pain to perform functional activities Baseline:  Goal status: INITIAL    LONG TERM GOALS: Target date: 04/28/24  Pt will be independent with advanced HEP to assist with strengthening and pain management L knee Baseline:  Goal status: INITIAL  2.  Pt will demonstrate improvement in L knee extension strength >/=10 points to assist with functional activities Baseline:  Knee extension 28.4 19.5   Goal status: INITIAL  3.  Pt will be able to stand >/= 15 minutes without L knee pain to be able to perform functional activities Baseline:  Goal status: INITIAL  4.  Pt will be  able to transfer sit to stand without L knee pain 75% of the time Baseline:  Goal status: INITIAL  5.  Pt will be able to sit >/=30 minutes with </=3/10 L knee pain to eat dinner Baseline:  Goal status: INITIAL  PLAN:  PT FREQUENCY: 1x/week  PT DURATION: 12 weeks  PLANNED INTERVENTIONS: 97164- PT Re-evaluation, 97110-Therapeutic exercises, 97530- Therapeutic activity, [id number]- Neuromuscular re-education, 97140- Manual therapy, [id number]- Gait training, [id number]- Ultrasound, [id number]- Ionotophoresis 4mg /ml Dexamethasone , Patient/Family education, Stair training, and Taping.  PLAN FOR NEXT SESSION: D/C?   Rojean JONELLE Batten, PT 04/21/2024, 7:40 AM   "
